# Patient Record
Sex: Female | Born: 1960 | Race: Black or African American | Hispanic: No | Smoking: Never smoker
Health system: Southern US, Community
[De-identification: ages and names within clinical notes are randomized; demographics above are authoritative.]

## PROBLEM LIST (undated history)

## (undated) DIAGNOSIS — D649 Anemia, unspecified: Secondary | ICD-10-CM

## (undated) DIAGNOSIS — I10 Essential (primary) hypertension: Secondary | ICD-10-CM

## (undated) DIAGNOSIS — K219 Gastro-esophageal reflux disease without esophagitis: Secondary | ICD-10-CM

## (undated) DIAGNOSIS — M1990 Unspecified osteoarthritis, unspecified site: Secondary | ICD-10-CM

## (undated) HISTORY — PX: ABDOMINAL HYSTERECTOMY: SHX81

## (undated) HISTORY — DX: Essential (primary) hypertension: I10

## (undated) HISTORY — DX: Unspecified osteoarthritis, unspecified site: M19.90

## (undated) HISTORY — PX: BREAST EXCISIONAL BIOPSY: SUR124

## (undated) HISTORY — PX: FOOT SURGERY: SHX648

## (undated) HISTORY — DX: Gastro-esophageal reflux disease without esophagitis: K21.9

## (undated) HISTORY — PX: TUBAL LIGATION: SHX77

## (undated) HISTORY — DX: Anemia, unspecified: D64.9

---

## 2000-01-25 ENCOUNTER — Other Ambulatory Visit: Admission: RE | Admit: 2000-01-25 | Discharge: 2000-01-25 | Payer: Self-pay | Admitting: Obstetrics and Gynecology

## 2001-03-09 ENCOUNTER — Other Ambulatory Visit: Admission: RE | Admit: 2001-03-09 | Discharge: 2001-03-09 | Payer: Self-pay | Admitting: Obstetrics and Gynecology

## 2001-06-29 ENCOUNTER — Encounter (INDEPENDENT_AMBULATORY_CARE_PROVIDER_SITE_OTHER): Payer: Self-pay

## 2001-06-29 ENCOUNTER — Ambulatory Visit (HOSPITAL_COMMUNITY): Admission: RE | Admit: 2001-06-29 | Discharge: 2001-06-29 | Payer: Self-pay | Admitting: Obstetrics and Gynecology

## 2002-03-29 ENCOUNTER — Other Ambulatory Visit: Admission: RE | Admit: 2002-03-29 | Discharge: 2002-03-29 | Payer: Self-pay | Admitting: Obstetrics and Gynecology

## 2003-04-16 ENCOUNTER — Other Ambulatory Visit: Admission: RE | Admit: 2003-04-16 | Discharge: 2003-04-16 | Payer: Self-pay | Admitting: Obstetrics and Gynecology

## 2003-06-04 ENCOUNTER — Encounter: Admission: RE | Admit: 2003-06-04 | Discharge: 2003-06-04 | Payer: Self-pay | Admitting: Obstetrics and Gynecology

## 2004-01-15 ENCOUNTER — Encounter: Admission: RE | Admit: 2004-01-15 | Discharge: 2004-01-15 | Payer: Self-pay | Admitting: Obstetrics and Gynecology

## 2004-01-23 ENCOUNTER — Ambulatory Visit (HOSPITAL_COMMUNITY): Admission: RE | Admit: 2004-01-23 | Discharge: 2004-01-23 | Payer: Self-pay | Admitting: Interventional Radiology

## 2004-03-15 ENCOUNTER — Inpatient Hospital Stay (HOSPITAL_COMMUNITY): Admission: RE | Admit: 2004-03-15 | Discharge: 2004-03-17 | Payer: Self-pay | Admitting: Obstetrics and Gynecology

## 2004-03-15 ENCOUNTER — Encounter (INDEPENDENT_AMBULATORY_CARE_PROVIDER_SITE_OTHER): Payer: Self-pay | Admitting: Specialist

## 2004-07-30 ENCOUNTER — Ambulatory Visit: Payer: Self-pay | Admitting: Licensed Clinical Social Worker

## 2004-08-05 ENCOUNTER — Ambulatory Visit: Payer: Self-pay | Admitting: Licensed Clinical Social Worker

## 2004-08-05 ENCOUNTER — Ambulatory Visit: Payer: Self-pay | Admitting: Internal Medicine

## 2004-08-26 ENCOUNTER — Ambulatory Visit: Payer: Self-pay | Admitting: Internal Medicine

## 2004-09-15 ENCOUNTER — Ambulatory Visit: Payer: Self-pay | Admitting: Internal Medicine

## 2004-10-14 ENCOUNTER — Ambulatory Visit: Payer: Self-pay | Admitting: Internal Medicine

## 2005-01-21 ENCOUNTER — Ambulatory Visit: Payer: Self-pay | Admitting: Internal Medicine

## 2005-04-22 ENCOUNTER — Ambulatory Visit: Payer: Self-pay | Admitting: Internal Medicine

## 2005-07-20 ENCOUNTER — Ambulatory Visit: Payer: Self-pay | Admitting: Internal Medicine

## 2005-08-02 ENCOUNTER — Encounter: Admission: RE | Admit: 2005-08-02 | Discharge: 2005-08-02 | Payer: Self-pay | Admitting: Obstetrics and Gynecology

## 2005-09-29 ENCOUNTER — Ambulatory Visit: Payer: Self-pay | Admitting: Internal Medicine

## 2005-11-03 ENCOUNTER — Ambulatory Visit: Payer: Self-pay | Admitting: Internal Medicine

## 2005-11-25 ENCOUNTER — Ambulatory Visit: Payer: Self-pay | Admitting: Internal Medicine

## 2006-02-13 ENCOUNTER — Ambulatory Visit: Payer: Self-pay | Admitting: Internal Medicine

## 2006-02-15 ENCOUNTER — Encounter: Admission: RE | Admit: 2006-02-15 | Discharge: 2006-02-15 | Payer: Self-pay | Admitting: Internal Medicine

## 2006-05-17 ENCOUNTER — Ambulatory Visit: Payer: Self-pay | Admitting: Internal Medicine

## 2006-08-16 ENCOUNTER — Encounter: Admission: RE | Admit: 2006-08-16 | Discharge: 2006-08-16 | Payer: Self-pay | Admitting: Obstetrics and Gynecology

## 2006-09-05 ENCOUNTER — Ambulatory Visit: Payer: Self-pay | Admitting: Internal Medicine

## 2007-02-28 ENCOUNTER — Telehealth: Payer: Self-pay | Admitting: *Deleted

## 2007-02-28 DIAGNOSIS — I10 Essential (primary) hypertension: Secondary | ICD-10-CM | POA: Insufficient documentation

## 2007-02-28 DIAGNOSIS — K219 Gastro-esophageal reflux disease without esophagitis: Secondary | ICD-10-CM | POA: Insufficient documentation

## 2007-02-28 DIAGNOSIS — D509 Iron deficiency anemia, unspecified: Secondary | ICD-10-CM | POA: Insufficient documentation

## 2007-03-01 ENCOUNTER — Ambulatory Visit: Payer: Self-pay | Admitting: Internal Medicine

## 2007-03-01 DIAGNOSIS — IMO0002 Reserved for concepts with insufficient information to code with codable children: Secondary | ICD-10-CM | POA: Insufficient documentation

## 2007-03-01 DIAGNOSIS — M5380 Other specified dorsopathies, site unspecified: Secondary | ICD-10-CM | POA: Insufficient documentation

## 2007-03-01 DIAGNOSIS — M1990 Unspecified osteoarthritis, unspecified site: Secondary | ICD-10-CM | POA: Insufficient documentation

## 2007-03-01 DIAGNOSIS — M1710 Unilateral primary osteoarthritis, unspecified knee: Secondary | ICD-10-CM | POA: Insufficient documentation

## 2007-03-13 ENCOUNTER — Emergency Department (HOSPITAL_COMMUNITY): Admission: EM | Admit: 2007-03-13 | Discharge: 2007-03-13 | Payer: Self-pay | Admitting: Emergency Medicine

## 2007-03-14 ENCOUNTER — Telehealth (INDEPENDENT_AMBULATORY_CARE_PROVIDER_SITE_OTHER): Payer: Self-pay | Admitting: *Deleted

## 2007-03-16 ENCOUNTER — Ambulatory Visit: Payer: Self-pay | Admitting: Internal Medicine

## 2007-03-16 DIAGNOSIS — M5030 Other cervical disc degeneration, unspecified cervical region: Secondary | ICD-10-CM | POA: Insufficient documentation

## 2007-03-24 ENCOUNTER — Encounter: Admission: RE | Admit: 2007-03-24 | Discharge: 2007-03-24 | Payer: Self-pay | Admitting: Internal Medicine

## 2007-04-09 ENCOUNTER — Encounter: Admission: RE | Admit: 2007-04-09 | Discharge: 2007-05-16 | Payer: Self-pay | Admitting: Internal Medicine

## 2007-04-19 ENCOUNTER — Ambulatory Visit: Payer: Self-pay | Admitting: Internal Medicine

## 2007-04-20 ENCOUNTER — Ambulatory Visit: Payer: Self-pay | Admitting: Internal Medicine

## 2007-04-20 LAB — CONVERTED CEMR LAB
ALT: 22 units/L (ref 0–35)
AST: 25 units/L (ref 0–37)
Albumin: 3.8 g/dL (ref 3.5–5.2)
Alkaline Phosphatase: 57 units/L (ref 39–117)
BUN: 6 mg/dL (ref 6–23)
Basophils Absolute: 0 10*3/uL (ref 0.0–0.1)
Basophils Relative: 0.1 % (ref 0.0–1.0)
Bilirubin Urine: NEGATIVE
Bilirubin, Direct: 0.1 mg/dL (ref 0.0–0.3)
Blood in Urine, dipstick: NEGATIVE
CO2: 32 meq/L (ref 19–32)
Calcium: 9.4 mg/dL (ref 8.4–10.5)
Chloride: 105 meq/L (ref 96–112)
Cholesterol: 157 mg/dL (ref 0–200)
Creatinine, Ser: 0.9 mg/dL (ref 0.4–1.2)
Eosinophils Absolute: 0.1 10*3/uL (ref 0.0–0.6)
Eosinophils Relative: 1.1 % (ref 0.0–5.0)
GFR calc Af Amer: 87 mL/min
GFR calc non Af Amer: 72 mL/min
Glucose, Bld: 97 mg/dL (ref 70–99)
Glucose, Urine, Semiquant: NEGATIVE
HCT: 34.3 % — ABNORMAL LOW (ref 36.0–46.0)
HDL: 56.6 mg/dL (ref >39.0)
Hemoglobin: 11.8 g/dL — ABNORMAL LOW (ref 12.0–15.0)
LDL Cholesterol: 85 mg/dL (ref 0–99)
Lymphocytes Relative: 31.8 % (ref 12.0–46.0)
MCHC: 34.3 g/dL (ref 30.0–36.0)
MCV: 86.1 fL (ref 78.0–100.0)
Monocytes Absolute: 0.5 10*3/uL (ref 0.2–0.7)
Monocytes Relative: 8.5 % (ref 3.0–11.0)
Neutro Abs: 3.3 10*3/uL (ref 1.4–7.7)
Neutrophils Relative %: 58.5 % (ref 43.0–77.0)
Nitrite: NEGATIVE
Platelets: 338 10*3/uL (ref 150–400)
Potassium: 4.5 meq/L (ref 3.5–5.1)
RBC: 3.99 M/uL (ref 3.87–5.11)
RDW: 12.2 % (ref 11.5–14.6)
Sodium: 142 meq/L (ref 135–145)
Specific Gravity, Urine: 1.02
TSH: 1 microintl units/mL (ref 0.35–5.50)
Total Bilirubin: 0.6 mg/dL (ref 0.3–1.2)
Total CHOL/HDL Ratio: 2.8
Total Protein: 7.2 g/dL (ref 6.0–8.3)
Triglycerides: 75 mg/dL (ref 0–149)
Urobilinogen, UA: 0.2
VLDL: 15 mg/dL (ref 0–40)
WBC Urine, dipstick: NEGATIVE
WBC: 5.7 10*3/uL (ref 4.5–10.5)
pH: 7

## 2007-04-27 ENCOUNTER — Ambulatory Visit: Payer: Self-pay | Admitting: Internal Medicine

## 2007-11-15 ENCOUNTER — Encounter: Admission: RE | Admit: 2007-11-15 | Discharge: 2007-11-15 | Payer: Self-pay | Admitting: Obstetrics and Gynecology

## 2008-03-27 ENCOUNTER — Ambulatory Visit: Payer: Self-pay | Admitting: Internal Medicine

## 2008-12-01 ENCOUNTER — Encounter: Admission: RE | Admit: 2008-12-01 | Discharge: 2008-12-01 | Payer: Self-pay | Admitting: Obstetrics and Gynecology

## 2009-01-22 ENCOUNTER — Telehealth: Payer: Self-pay | Admitting: Internal Medicine

## 2009-01-23 ENCOUNTER — Telehealth: Payer: Self-pay | Admitting: Internal Medicine

## 2009-03-31 LAB — CONVERTED CEMR LAB: Pap Smear: NORMAL

## 2009-04-28 ENCOUNTER — Ambulatory Visit: Payer: Self-pay | Admitting: Internal Medicine

## 2009-04-28 LAB — CONVERTED CEMR LAB
ALT: 19 units/L (ref 0–35)
AST: 23 units/L (ref 0–37)
Albumin: 3.9 g/dL (ref 3.5–5.2)
Alkaline Phosphatase: 47 units/L (ref 39–117)
BUN: 7 mg/dL (ref 6–23)
Basophils Absolute: 0 10*3/uL (ref 0.0–0.1)
Basophils Relative: 0 % (ref 0.0–3.0)
Bilirubin Urine: NEGATIVE
Bilirubin, Direct: 0.1 mg/dL (ref 0.0–0.3)
CO2: 32 meq/L (ref 19–32)
Calcium: 9 mg/dL (ref 8.4–10.5)
Chloride: 110 meq/L (ref 96–112)
Cholesterol: 160 mg/dL (ref 0–200)
Creatinine, Ser: 0.9 mg/dL (ref 0.4–1.2)
Eosinophils Absolute: 0.1 10*3/uL (ref 0.0–0.7)
Eosinophils Relative: 1.1 % (ref 0.0–5.0)
GFR calc non Af Amer: 86.64 mL/min (ref >60)
Glucose, Bld: 97 mg/dL (ref 70–99)
HCT: 35.1 % — ABNORMAL LOW (ref 36.0–46.0)
HDL: 65 mg/dL (ref >39.00)
Hemoglobin, Urine: NEGATIVE
Hemoglobin: 11.7 g/dL — ABNORMAL LOW (ref 12.0–15.0)
Ketones, ur: NEGATIVE mg/dL
LDL Cholesterol: 84 mg/dL (ref 0–99)
Leukocytes, UA: NEGATIVE
Lymphocytes Relative: 30.9 % (ref 12.0–46.0)
Lymphs Abs: 1.6 10*3/uL (ref 0.7–4.0)
MCHC: 33.3 g/dL (ref 30.0–36.0)
MCV: 87.9 fL (ref 78.0–100.0)
Monocytes Absolute: 0.5 10*3/uL (ref 0.1–1.0)
Monocytes Relative: 9.4 % (ref 3.0–12.0)
Neutro Abs: 3.1 10*3/uL (ref 1.4–7.7)
Neutrophils Relative %: 58.6 % (ref 43.0–77.0)
Nitrite: NEGATIVE
Platelets: 285 10*3/uL (ref 150.0–400.0)
Potassium: 4.2 meq/L (ref 3.5–5.1)
RBC: 3.99 M/uL (ref 3.87–5.11)
RDW: 12.9 % (ref 11.5–14.6)
Sodium: 143 meq/L (ref 135–145)
Specific Gravity, Urine: 1.015 (ref 1.000–1.030)
TSH: 0.88 microintl units/mL (ref 0.35–5.50)
Total Bilirubin: 0.4 mg/dL (ref 0.3–1.2)
Total CHOL/HDL Ratio: 2
Total Protein: 7.2 g/dL (ref 6.0–8.3)
Triglycerides: 53 mg/dL (ref 0.0–149.0)
Urine Glucose: NEGATIVE mg/dL
Urobilinogen, UA: 0.2 (ref 0.0–1.0)
VLDL: 10.6 mg/dL (ref 0.0–40.0)
WBC: 5.3 10*3/uL (ref 4.5–10.5)
pH: 7 (ref 5.0–8.0)

## 2009-05-04 ENCOUNTER — Ambulatory Visit: Payer: Self-pay | Admitting: Internal Medicine

## 2009-09-19 ENCOUNTER — Emergency Department (HOSPITAL_COMMUNITY): Admission: EM | Admit: 2009-09-19 | Discharge: 2009-09-19 | Payer: Self-pay | Admitting: Emergency Medicine

## 2009-11-03 ENCOUNTER — Ambulatory Visit: Payer: Self-pay | Admitting: Internal Medicine

## 2009-11-03 LAB — CONVERTED CEMR LAB
Basophils Absolute: 0 10*3/uL (ref 0.0–0.1)
Basophils Relative: 0.6 % (ref 0.0–3.0)
Eosinophils Absolute: 0.1 10*3/uL (ref 0.0–0.7)
Eosinophils Relative: 1.3 % (ref 0.0–5.0)
HCT: 38.1 % (ref 36.0–46.0)
Hemoglobin: 13.2 g/dL (ref 12.0–15.0)
Lymphocytes Relative: 28.3 % (ref 12.0–46.0)
Lymphs Abs: 1.7 10*3/uL (ref 0.7–4.0)
MCHC: 34.7 g/dL (ref 30.0–36.0)
MCV: 87.9 fL (ref 78.0–100.0)
Monocytes Absolute: 0.5 10*3/uL (ref 0.1–1.0)
Monocytes Relative: 7.6 % (ref 3.0–12.0)
Neutro Abs: 3.8 10*3/uL (ref 1.4–7.7)
Neutrophils Relative %: 62.2 % (ref 43.0–77.0)
Platelets: 265 10*3/uL (ref 150.0–400.0)
RBC: 4.34 M/uL (ref 3.87–5.11)
RDW: 14.1 % (ref 11.5–14.6)
WBC: 6.1 10*3/uL (ref 4.5–10.5)

## 2009-11-16 ENCOUNTER — Ambulatory Visit: Payer: Self-pay | Admitting: Internal Medicine

## 2009-11-16 DIAGNOSIS — M79609 Pain in unspecified limb: Secondary | ICD-10-CM | POA: Insufficient documentation

## 2010-01-04 ENCOUNTER — Encounter: Admission: RE | Admit: 2010-01-04 | Discharge: 2010-01-04 | Payer: Self-pay | Admitting: Obstetrics and Gynecology

## 2010-03-03 ENCOUNTER — Encounter: Payer: Self-pay | Admitting: Internal Medicine

## 2010-05-12 ENCOUNTER — Ambulatory Visit: Payer: Self-pay | Admitting: Internal Medicine

## 2010-05-12 LAB — CONVERTED CEMR LAB
ALT: 15 U/L
AST: 18 U/L
Albumin: 3.8 g/dL
Alkaline Phosphatase: 56 U/L
BUN: 12 mg/dL
Basophils Absolute: 0 10*3/uL
Basophils Relative: 0.6 %
Bilirubin Urine: NEGATIVE
Bilirubin, Direct: 0 mg/dL
CO2: 31 meq/L
Calcium: 9.2 mg/dL
Chloride: 106 meq/L
Cholesterol: 170 mg/dL
Creatinine, Ser: 0.9 mg/dL
Eosinophils Absolute: 0.1 10*3/uL
Eosinophils Relative: 1.2 %
GFR calc non Af Amer: 87.36 mL/min
Glucose, Bld: 105 mg/dL — ABNORMAL HIGH
HCT: 38.4 %
HDL: 55.9 mg/dL
Hemoglobin, Urine: NEGATIVE
Hemoglobin: 13.1 g/dL
Ketones, ur: NEGATIVE mg/dL
LDL Cholesterol: 100 mg/dL — ABNORMAL HIGH
Leukocytes, UA: NEGATIVE
Lymphocytes Relative: 29.6 %
Lymphs Abs: 1.8 10*3/uL
MCHC: 34.1 g/dL
MCV: 88.5 fL
Monocytes Absolute: 0.5 10*3/uL
Monocytes Relative: 7.5 %
Neutro Abs: 3.7 10*3/uL
Neutrophils Relative %: 61.1 %
Nitrite: NEGATIVE
Platelets: 255 10*3/uL
Potassium: 4.3 meq/L
RBC: 4.34 M/uL
RDW: 14.1 %
Sodium: 142 meq/L
Specific Gravity, Urine: 1.025
TSH: 1.2 u[IU]/mL
Total Bilirubin: 0.4 mg/dL
Total CHOL/HDL Ratio: 3
Total Protein, Urine: NEGATIVE mg/dL
Total Protein: 6.9 g/dL
Triglycerides: 70 mg/dL
Urine Glucose: NEGATIVE mg/dL
Urobilinogen, UA: 0.2
VLDL: 14 mg/dL
WBC: 6 10*3/uL
pH: 6

## 2010-05-19 ENCOUNTER — Ambulatory Visit: Payer: Self-pay | Admitting: Internal Medicine

## 2010-05-19 ENCOUNTER — Encounter: Payer: Self-pay | Admitting: Internal Medicine

## 2010-05-20 ENCOUNTER — Encounter: Payer: Self-pay | Admitting: Internal Medicine

## 2010-05-29 ENCOUNTER — Encounter: Admission: RE | Admit: 2010-05-29 | Discharge: 2010-05-29 | Payer: Self-pay | Admitting: Internal Medicine

## 2010-06-09 ENCOUNTER — Telehealth: Payer: Self-pay | Admitting: Internal Medicine

## 2010-06-30 ENCOUNTER — Ambulatory Visit: Payer: Self-pay | Admitting: Internal Medicine

## 2010-08-09 ENCOUNTER — Ambulatory Visit
Admission: RE | Admit: 2010-08-09 | Discharge: 2010-08-09 | Payer: Self-pay | Source: Home / Self Care | Attending: Internal Medicine | Admitting: Internal Medicine

## 2010-08-31 NOTE — Procedures (Signed)
Summary: Upper Endoscopy Report/[facility]  Upper Endoscopy Report/[facility]   Imported By: Maryln Gottron 03/08/2010 11:11:02  _____________________________________________________________________  External Attachment:    Type:   Image     Comment:   External Document

## 2010-08-31 NOTE — Assessment & Plan Note (Signed)
Summary: knee pain   Vital Signs:  Patient Profile:   50 Years Old Female Height:     66 inches Weight:      180 pounds Temp:     98.4 degrees F Pulse rate:   84 / minute Resp:     12 per minute BP sitting:   136 / 90  (left arm)  Vitals Entered By: Willy Eddy, LPN (March 01, 2007 2:28 PM)               Chief Complaint:  c/o of rt knee pain.  History of Present Illness: patient is a pleasant, 50 year old female with a history of injury to her knees, who presents with 8 and acute and subacute injury to her right knee and a history of a previous similar injury to her left knee.  The patient plays softball and has had minor cartilage tears to her knees before with significant acute joint swelling, erythema, tenderness, and decrease in range of motion.  Today.  She presents with a subacute injury to her right knee that is tender obviously swollen affecting her ability to ambulate and exercise.  Her other medical problems that we follow her for include hypertension, iron deficiency anemia.  Her medications are reviewed and updated in the chart.  Current Allergies: ! * LAMISIL ! MORPHINE SULFATE CR (MORPHINE SULFATE) ! CODEINE PHOSPHATE (CODEINE PHOSPHATE)  Past Medical History:    Anemia-iron deficiency    GERD    Hypertension    Osteoarthritis  Past Surgical History:    Caesarean section    Tubal ligation    tubular pregnacy    foot surgery   Family History:    father died for anurysm    mother has DM, HTn and breast CA    Family History Breast cancer 1st degree relative <50    Family History Diabetes 1st degree relative    Family History Hypertension  Social History:    Single    Never Smoked   Risk Factors:  Tobacco use:  never   Review of Systems  The patient denies anorexia, weight loss, vision loss, chest pain, dyspnea on exhertion, peripheral edema, prolonged cough, abdominal pain, melena, and severe indigestion/heartburn.     Physical  Exam  General:     alert, well-developed, and well-nourished.   Head:     normocephalic.   Nose:     no nasal discharge.   Neck:     supple.   Lungs:     normal respiratory effort and normal breath sounds.   Heart:     normal rate and regular rhythm.   Abdomen:     soft and non-tender.   Msk:     no redness over joints, joint tenderness, joint swelling, and joint warmth.  over right medial meniscus. Pulses:     R and L carotid,radial,femoral,dorsalis pedis and posterior tibial pulses are full and equal bilaterally Extremities:     No clubbing, cyanosis, edema, or deformity noted with normal full range of motion of all joints.   Neurologic:     No cranial nerve deficits noted. Station and gait are normal. Plantar reflexes are down-going bilaterally. DTRs are symmetrical throughout. Sensory, motor and coordinative functions appear intact.    Impression & Recommendations:  Problem # 1:  MUSCLE SPASM, LUMBAR REGION (ICD-724.8)  Her updated medication list for this problem includes:    Durabac 325-250-20-50 Mg Caps (Apap-salicyl-phenyltolox-caff) ..... One by mouth q 6 hours as need for spasm  Problem # 2:  GERD (ICD-530.81)  Her updated medication list for this problem includes:    Nexium 40 Mg Cpdr (Esomeprazole magnesium) ..... One by mouth daily  Diagnostics Reviewed:  Discussed lifestyle modifications, diet, antacids/medications, and preventive measures. Handout provided.   Problem # 3:  TEAR, MEDIAL MENISCUS, KNEE, CURRENT (ICD-836.0) depo 40 mg injectd into knee Orders: Joint Aspirate / Injection, Large (20610)   Problem # 4:  HYPERTENSION (ICD-401.9)  Her updated medication list for this problem includes:    Ziac 5-6.25 Mg Tabs (Bisoprolol-hydrochlorothiazide) ..... Once daily    Lotrel 5-20 Mg Caps (Amlodipine besy-benazepril hcl) ..... Once daily  BP today: 136/90   Complete Medication List: 1)  Ziac 5-6.25 Mg Tabs (Bisoprolol-hydrochlorothiazide) ....  Once daily 2)  Therapeutic Multivitamin Tabs (Multiple vitamin) .... Once daily 3)  Lotrel 5-20 Mg Caps (Amlodipine besy-benazepril hcl) .... Once daily 4)  Durabac 325-250-20-50 Mg Caps (Apap-salicyl-phenyltolox-caff) .... One by mouth q 6 hours as need for spasm 5)  Nexium 40 Mg Cpdr (Esomeprazole magnesium) .... One by mouth daily   Patient Instructions: 1)  patient presents today for an acute visit for knee pain.  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  I suspect this is again a recurrent small meniscal tear to the knee and have injected.  The joint with 40 mg of Depo-Medrol.  She should continue to ice the knee, 3 to 4 times a day, and to not run or play sports for several weeks. 2)  She asked for a list of for appropriate supplements for her age: 71)  visual or flax seed oil 1 g capsules and 3 or 4 a day and vitamin D 1000 international units once a day, Os-Cal, or calcium with vitamin D 500 to 600 mg with 200 to 400 mg of vitamin D in each tablet two tablets a day, a multivitamin, a multivitamin.  These are the current recommendations for women, and I would add to this, an 81-mg aspirin once a day.    Prescriptions: NEXIUM 40 MG  CPDR (ESOMEPRAZOLE MAGNESIUM) one by mouth daily  #30 x 11   Entered and Authorized by:   Stacie Glaze MD   Signed by:   Stacie Glaze MD on 03/01/2007   Method used:   Electronically sent to ...       [facility] [address]       [address] [place], [place]  [address]       Ph: [phone]       Fax: [phone]   RxID:   [id number] DURABAC 325-250-20-50 MG  CAPS (APAP-SALICYL-PHENYLTOLOX-CAFF) one by mouth q 6 hours as need for spasm  #30 x 5   Entered and Authorized by:   Stacie Glaze MD   Signed by:   Stacie Glaze MD on 03/01/2007   Method used:   Electronically sent to ...       [facility] [address]        [address] [place], [place]  [address]       Ph: [phone]       Fax: [phone]   RxID:   [id number]

## 2010-08-31 NOTE — Assessment & Plan Note (Signed)
Summary: cpx/no pap/njr   Vital Signs:  Patient profile:   50 year old female Height:      66 inches Weight:      186 pounds BMI:     30.13 Temp:     98.2 degrees F oral Pulse rate:   72 / minute Resp:     14 per minute BP sitting:   130 / 80  (left arm)  Vitals Entered By: Willy Eddy, LPN (May 04, 2009 3:22 PM)  CC:  cpx.  History of Present Illness: Mild anemia and need to stay on the iron The pt was asked about all immunizations, health maint. 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 Once Daily 2)  Therapeutic Multivitamin   Tabs (Multiple Vitamin) .... Once Daily 3)  Lotrel 10-20 Mg Caps (Amlodipine Besy-Benazepril Hcl) .Marland Kitchen.. 1 Once Daily 4)  Durabac 325-250-20-50 Mg  Caps (Apap-Salicyl-Phenyltolox-Caff) .... One By Mouth Q 6 Hours As Need For Spasm 5)  Omeprazole 20 Mg Cpdr (Omeprazole) .... One By Mouth Daily 6)  Feosol 45 Mg  Tabs (Iron) .... Once Daily 7)  Bl Flax Seed Oil 1000 Mg  Caps (Flaxseed (Linseed)) .... Once Daily 8)  Omega-3 350 Mg  Caps (Omega-3 Fatty Acids) .... Once Daily 9)  Calcium 500/d 500-125 Mg-Unit  Tabs (Calcium Carbonate-Vitamin D) .... Once Daily 10)  Stress 500 B-Complex   Tabs (B Complex-C-Folic Acid) .... Once Daily 11)  Reglan 5 Mg  Tabs (Metoclopramide  Hcl) .... Take One By Mouth Q Ac Supper and Hs  Current Medications (verified): 1)  Ziac 5-6.25 Mg  Tabs (Bisoprolol-Hydrochlorothiazide) .... Once Daily 2)  Therapeutic Multivitamin   Tabs (Multiple Vitamin) .... Once Daily 3)  Lotrel 10-20 Mg Caps (Amlodipine Besy-Benazepril Hcl) .Marland Kitchen.. 1 Once Daily 4)  Durabac 325-250-20-50 Mg  Caps (Apap-Salicyl-Phenyltolox-Caff) .... One By Mouth Q 6 Hours As Need For Spasm 5)  Dexilant 60 Mg Cpdr (Dexlansoprazole) .Marland Kitchen.. 1 Once Daily 6)  Feosol 45 Mg  Tabs (Iron) .... Once Daily 7)  Bl Flax Seed Oil 1000 Mg  Caps (Flaxseed (Linseed)) .... Once Daily 8)  Omega-3 350 Mg  Caps (Omega-3 Fatty Acids) .... Once Daily 9)  Calcium 500/d 500-125 Mg-Unit  Tabs (Calcium Carbonate-Vitamin D) .... Once Daily 10)  Stress 500 B-Complex   Tabs (B Complex-C-Folic Acid) .... Once Daily 11)  Reglan 5 Mg  Tabs (Metoclopramide Hcl) .... Take One By Mouth Q Ac Supper and Hs  Allergies (verified): 1)  ! * Lamisil 2)  ! Morphine Sulfate Cr (Morphine Sulfate) 3)  ! Codeine Phosphate (Codeine Phosphate)  Past History:  Family History: Last updated: Mar 14, 2007 father died for anurysm mother has DM, HTn and breast CA Family History Breast cancer 1st degree relative <50 Family History Diabetes 1st degree relative Family History Hypertension  Social History: Last updated:  03/01/2007 Single Never Smoked  Risk Factors: Smoking Status: never (03/01/2007)  Past medical, surgical, family and social histories (including risk factors) reviewed, and no changes noted (except as noted below).  Past Medical History: Reviewed history from 03/01/2007 and no changes required. Anemia-iron deficiency GERD Hypertension Osteoarthritis  Past Surgical History: Reviewed history from 03/01/2007 and no changes required. Caesarean section Tubal ligation tubular pregnacy foot surgery  Family History: Reviewed history from 03/01/2007 and no changes required. father died for  anurysm mother has DM, HTn and breast CA Family History Breast cancer 1st degree relative <50 Family History Diabetes 1st degree relative Family History Hypertension  Social History: Reviewed history from 03/01/2007 and no changes required. Single Never Smoked  Review of Systems  The patient denies anorexia, fever, weight loss, weight gain, vision loss, decreased hearing, hoarseness, chest pain, syncope, dyspnea on exertion, peripheral edema, prolonged cough, headaches, hemoptysis, abdominal pain, melena, hematochezia, severe indigestion/heartburn, hematuria, incontinence, genital sores, muscle weakness, suspicious skin lesions, transient blindness, difficulty walking, depression, unusual weight change, abnormal bleeding, enlarged lymph nodes, angioedema, and breast masses.    Physical Exam  General:  AA femalewell-developed and well-nourished.   Head:  Normocephalic and atraumatic without obvious abnormalities. No apparent alopecia or balding. Eyes:  No corneal or conjunctival inflammation noted. EOMI. Perrla. Funduscopic exam benign, without hemorrhages, exudates or papilledema. Vision grossly normal. Nose:  External nasal examination shows no deformity or inflammation. Nasal mucosa are pink and moist without lesions or exudates. Mouth:  Oral mucosa and oropharynx without lesions or exudates.  Teeth in good repair. Neck:  No deformities, masses, or tenderness noted. Lungs:  Normal respiratory effort, chest expands symmetrically. Lungs are clear to auscultation, no crackles or wheezes. Heart:  Normal rate and regular rhythm. S1 and S2 normal without gallop, murmur, click, rub or other extra sounds. Abdomen:  Bowel sounds positive,abdomen soft and non-tender without masses, organomegaly or hernias noted. Msk:  decreased ROM, joint tenderness, and joint warmth.   Pulses:  R and L carotid,radial,femoral,dorsalis pedis and posterior tibial pulses are full and equal bilaterally Extremities:   No clubbing, cyanosis, edema, or deformity noted with normal full range of motion of all joints.   Neurologic:  No cranial nerve deficits noted. Station and gait are normal. Plantar reflexes are down-going bilaterally. DTRs are symmetrical throughout. Sensory, motor and coordinative functions appear intact.   Impression & Recommendations:  Problem # 1:  PREVENTIVE HEALTH CARE (ICD-V70.0)  The pt was asked about all immunizations, health maint. services that are appropriate to their age and was given guidance on diet exercize  and weight management  Mammogram: normal (03/31/2009) Pap smear: normal (03/31/2009) Td Booster: Tdap (04/27/2007)   Chol: 160 (04/28/2009)   HDL: 65.00 (04/28/2009)   LDL: 84 (04/28/2009)   TG: 53.0 (04/28/2009) TSH: 0.88 (04/28/2009)   Next mammogram due:: 04/2010 (05/04/2009)  Discussed using sunscreen, use of alcohol, drug use, self breast exam, routine dental care, routine eye care, schedule for GYN exam, routine physical exam, seat belts, multiple vitamins, osteoporosis prevention, adequate calcium intake in diet, recommendations for immunizations, mammograms and Pap smears.  Discussed exercise and checking cholesterol.  Discussed gun safety, safe sex, and contraception.  Problem # 2:  HYPERTENSION (ICD-401.9) Assessment: Unchanged  Her updated medication list for this problem includes:    Ziac 5-6.25 Mg Tabs (Bisoprolol-hydrochlorothiazide) ..... Once daily    Lotrel 10-20 Mg Caps (Amlodipine besy-benazepril hcl) .Marland Kitchen... 1 once daily  BP today: 130/80 Prior BP: 130/70 (03/27/2008)  Labs Reviewed: K+: 4.2 (  04/28/2009) Creat: : 0.9 (04/28/2009)   Chol: 160 (04/28/2009)   HDL: 65.00 (04/28/2009)   LDL: 84 (04/28/2009)   TG: 53.0 (04/28/2009)  Problem # 3:  ANEMIA-IRON DEFICIENCY (ICD-280.9) stay on the iron after donating blood Her updated medication list for this problem includes:    Feosol 45 Mg Tabs (Iron) ..... Once daily  Hgb: 11.7 (04/28/2009)   Hct:  35.1 (04/28/2009)   Platelets: 285.0 (04/28/2009) RBC: 3.99 (04/28/2009)   RDW: 12.9 (04/28/2009)   WBC: 5.3 (04/28/2009) MCV: 87.9 (04/28/2009)   MCHC: 33.3 (04/28/2009) TSH: 0.88 (04/28/2009)  Complete Medication List: 1)  Ziac 5-6.25 Mg Tabs (Bisoprolol-hydrochlorothiazide) .... Once daily 2)  Therapeutic Multivitamin Tabs (Multiple vitamin) .... Once daily 3)  Lotrel 10-20 Mg Caps (Amlodipine besy-benazepril hcl) .Marland Kitchen.. 1 once daily 4)  Durabac 325-250-20-50 Mg Caps (Apap-salicyl-phenyltolox-caff) .... One by mouth q 6 hours as need for spasm 5)  Dexilant 60 Mg Cpdr (Dexlansoprazole) .Marland Kitchen.. 1 once daily 6)  Feosol 45 Mg Tabs (Iron) .... Once daily 7)  Bl Flax Seed Oil 1000 Mg Caps (Flaxseed (linseed)) .... Once daily 8)  Omega-3 350 Mg Caps (Omega-3 fatty acids) .... Once daily 9)  Calcium 500/d 500-125 Mg-unit Tabs (Calcium carbonate-vitamin d) .... Once daily 10)  Stress 500 B-complex Tabs (B complex-c-folic acid) .... Once daily 11)  Reglan 5 Mg Tabs (Metoclopramide hcl) .... Take one by mouth q ac supper and hs  Patient Instructions: 1)  Please schedule a follow-up appointment in 6 months. 2)  CBC w/ Diff prior to visit, ICD-9: 280.9   Preventive Care Screening  Mammogram:    Date:  03/31/2009    Next Due:  04/2010    Results:  normal   Pap Smear:    Date:  03/31/2009    Next Due:  04/2010    Results:  normal

## 2010-08-31 NOTE — Assessment & Plan Note (Signed)
Summary: knee injection/bmw   Vital Signs:  Patient profile:   50 year old female Height:      66 inches Weight:      192 pounds BMI:     31.10 Temp:     98.2 degrees F oral Pulse rate:   80 / minute Resp:     14 per minute BP sitting:   154 / 84  (left arm)  Vitals Entered By: Willy Eddy, LPN (June 30, 2010 11:54 AM) CC: rt knee pain-requesting injection, Hypertension Management Is Patient Diabetic? No   Primary Care Dusan Lipford:  Stacie Glaze MD  CC:  rt knee pain-requesting injection and Hypertension Management.  History of Present Illness: The pt went GI for a colon and  the Hgb monitering was up and the iron was stopped In pain and blood pressur is up some Knee pain for 30 days hx of OA and possible tear in meniscus prior injusrt similar and reponded to injection in past   Hypertension History:      She denies headache, chest pain, palpitations, dyspnea with exertion, orthopnea, PND, peripheral edema, visual symptoms, neurologic problems, syncope, and side effects from treatment.        Positive major cardiovascular risk factors include hypertension.  Negative major cardiovascular risk factors include female age less than 69 years old and non-tobacco-user status.     Preventive Screening-Counseling & Management  Alcohol-Tobacco     Smoking Status: never     Tobacco Counseling: not indicated; no tobacco use  Problems Prior to Update: 1)  Leg Pain, Left  (ICD-729.5) 2)  Preventive Health Care  (ICD-V70.0) 3)  Degeneration, Cervical Disc  (ICD-722.4) 4)  Muscle Spasm, Lumbar Region  (ICD-724.8) 5)  Tear, Medial Meniscus, Knee, Current  (ICD-836.0) 6)  Osteoarthrosis, Local Nos, Lower Leg  (ICD-715.36) 7)  Family History Diabetes 1st Degree Relative  (ICD-V18.0) 8)  Family History Breast Cancer 1st Degree Relative <50  (ICD-V16.3) 9)  Osteoarthritis  (ICD-715.90) 10)  Hypertension  (ICD-401.9) 11)  Gerd  (ICD-530.81) 12)  Anemia-iron Deficiency   (ICD-280.9)  Current Problems (verified): 1)  Leg Pain, Left  (ICD-729.5) 2)  Preventive Health Care  (ICD-V70.0) 3)  Degeneration, Cervical Disc  (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 Once Daily 2)  Therapeutic Multivitamin   Tabs (Multiple Vitamin) .... Once Daily 3)  Lotrel 10-20 Mg Caps (Amlodipine Besy-Benazepril Hcl) .Marland Kitchen.. 1 Once Daily 4)  Durabac 325-250-20-50 Mg  Caps (Apap-Salicyl-Phenyltolox-Caff) .... One By Mouth Q 6 Hours As Need For Spasm 5)  Dexilant 60 Mg Cpdr (Dexlansoprazole) .Marland Kitchen.. 1 Once Daily 6)  Feosol 45 Mg  Tabs (Iron) .... Once Daily 7)  Bl Flax Seed Oil 1000 Mg  Caps (Flaxseed (Linseed)) .... Once Daily 8)  Omega-3 350 Mg  Caps (Omega-3 Fatty Acids) .... Once Daily 9)  Calcium 500/d 500-125 Mg-Unit  Tabs (Calcium Carbonate-Vitamin D) .... Once Daily 10)  Methylprednisolone 4 Mg Tabs (Methylprednisolone) .... 4 For 2 Days, 3 For 2 Days, 2 For 2 Days, Then 1 For 2 Days  Current Medications (verified): 1)  Ziac 5-6.25 Mg  Tabs (Bisoprolol-Hydrochlorothiazide) .... Once Daily 2)  Therapeutic Multivitamin   Tabs (Multiple Vitamin) .... Once Daily 3)  Lotrel 10-20 Mg Caps (Amlodipine Besy-Benazepril Hcl) .Marland Kitchen.. 1 Once Daily 4)  Durabac 325-250-20-50 Mg  Caps (Apap-Salicyl-Phenyltolox-Caff) .... One By Mouth Q 6 Hours As Need For Spasm 5)  Dexilant 60 Mg Cpdr (Dexlansoprazole) .Marland Kitchen.. 1 Once Daily 6)  Bl Flax Seed Oil 1000 Mg  Caps (Flaxseed (Linseed)) .... Once Daily 7)  Omega-3 350 Mg  Caps  (Omega-3 Fatty Acids) .... Once Daily 8)  Calcium 500/d 500-125 Mg-Unit  Tabs (Calcium Carbonate-Vitamin D) .... Once Daily  Allergies (verified): 1)  ! * Lamisil 2)  ! Morphine Sulfate Cr (Morphine Sulfate) 3)  ! Codeine Phosphate (Codeine Phosphate)  Past History:  Family History: Last updated: 2007-03-21 father died for anurysm mother has DM, HTn and breast CA Family History Breast cancer 1st degree relative <50 Family History Diabetes 1st degree relative Family History Hypertension  Social History: Last updated: Mar 21, 2007 Single Never Smoked  Risk Factors: Smoking Status: never (06/30/2010)  Past medical, surgical, family and social histories (including risk factors) reviewed, and no changes noted (except as noted below).  Past Medical History: Reviewed history from 03/21/07 and no changes required. Anemia-iron deficiency GERD Hypertension Osteoarthritis  Past Surgical History: Reviewed history from 11/16/2009 and no changes required. Caesarean section Tubal ligation tubular pregnacy foot surgery  limited Hysterectomy  still has cervix! and ovaries  Family History: Reviewed history from 21-Mar-2007 and no changes required. father died for anurysm mother has DM, HTn and breast CA Family History Breast cancer 1st degree relative <50 Family History Diabetes 1st degree relative Family History Hypertension  Social History: Reviewed history from 03/21/2007 and no changes required. Single Never Smoked  Review of Systems  The patient denies anorexia, fever, weight loss, weight gain, vision loss, decreased hearing, hoarseness, chest pain, syncope, dyspnea on exertion, peripheral edema, prolonged cough, headaches, hemoptysis, abdominal pain, melena, hematochezia, severe indigestion/heartburn, hematuria, incontinence, genital sores, muscle weakness, suspicious skin lesions, transient blindness, difficulty walking, depression, unusual weight change, abnormal  bleeding, enlarged lymph nodes, angioedema, and breast masses.    Physical Exam  General:  AA femalewell-developed and well-nourished.   Head:  Normocephalic and atraumatic without obvious abnormalities. No apparent alopecia or balding. Eyes:  pupils equal and pupils round.   Ears:  R ear normal and L ear normal.   Lungs:  Normal respiratory effort, chest expands symmetrically. Lungs are clear to auscultation, no crackles or wheezes. Msk:  decreased ROM, joint tenderness, and joint swelling.   Neurologic:  alert & oriented X3 and gait normal.     Impression & Recommendations:  Problem # 1:  TEAR, MEDIAL MENISCUS, KNEE, CURRENT (ICD-836.0) the pt has a hx of similar injury and repsonse to injecton Informed consen obtained and then the right knee joint was prepped in a sterile manor and 40 mg depo and 1/2 cc 1% lidocaine injected into the synovial space. After care discussed. Pt tolerated procedure well.  Orders: Depo- Medrol 40mg  (J1030) Joint Aspirate / Injection, Large (20610)  Problem # 2:  OSTEOARTHROSIS, LOCAL NOS, LOWER LEG (ICD-715.36)  Her updated medication list for this problem includes:    Durabac 325-250-20-50 Mg Caps (Apap-salicyl-phenyltolox-caff) ..... One by mouth q 6 hours as need for spasm  Complete Medication List: 1)  Ziac 5-6.25 Mg Tabs (Bisoprolol-hydrochlorothiazide) .... Once daily 2)  Therapeutic Multivitamin Tabs (Multiple vitamin) .... Once daily 3)  Lotrel 10-20  Mg Caps (Amlodipine besy-benazepril hcl) .Marland Kitchen.. 1 once daily 4)  Durabac 325-250-20-50 Mg Caps (Apap-salicyl-phenyltolox-caff) .... One by mouth q 6 hours as need for spasm 5)  Dexilant 60 Mg Cpdr (Dexlansoprazole) .Marland Kitchen.. 1 once daily 6)  Bl Flax Seed Oil 1000 Mg Caps (Flaxseed (linseed)) .... Once daily 7)  Omega-3 350 Mg Caps (Omega-3 fatty acids) .... Once daily 8)  Calcium 500/d 500-125 Mg-unit Tabs (Calcium carbonate-vitamin d) .... Once daily  Hypertension Assessment/Plan:      The patient's  hypertensive risk group is category A: No risk factors and no target organ damage.  Her calculated 10 year risk of coronary heart disease is 6 %.  Today's blood pressure is 154/84.  Her blood pressure goal is < 140/90.   Orders Added: 1)  Est. Patient Level III [81191] 2)  Depo- Medrol 40mg  [J1030] 3)  Joint Aspirate / Injection, Large [20610]

## 2010-08-31 NOTE — Assessment & Plan Note (Signed)
Summary: CPX NO PAP//CCM/PT [facility] FROM BMP/CJR   Vital Signs:  Patient profile:   50 year old female Height:      66 inches Weight:      177 pounds BMI:     28.67 Temp:     98.5 degrees F oral Pulse rate:   80 / minute Pulse rhythm:   regular Resp:     14 per minute BP sitting:   130 / 78  (left arm) Cuff size:   regular  Vitals Entered By: Duard Brady LPN (May 19, 2010 2:02 PM) CC: cpx - doing well  Is Patient Diabetic? No   Primary Care Provider:  Stacie Glaze MD  CC:  cpx - doing well .  History of Present Illness: The pt was asked about all immunizations, health maint. 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 Once Daily 2)  Therapeutic Multivitamin   Tabs (Multiple Vitamin) .... Once Daily 3)  Lotrel 10-20 Mg Caps (Amlodipine Besy-Benazepril Hcl) .Marland Kitchen.. 1 Once Daily 4)  Durabac 325-250-20-50 Mg  Caps (Apap-Salicyl-Phenyltolox-Caff) ....  One By Mouth Q 6 Hours As Need For Spasm 5)  Dexilant 60 Mg Cpdr (Dexlansoprazole) .Marland Kitchen.. 1 Once Daily 6)  Feosol 45 Mg  Tabs (Iron) .... Once Daily 7)  Bl Flax Seed Oil 1000 Mg  Caps (Flaxseed (Linseed)) .... Once Daily 8)  Omega-3 350 Mg  Caps (Omega-3 Fatty Acids) .... Once Daily 9)  Calcium 500/d 500-125 Mg-Unit  Tabs (Calcium Carbonate-Vitamin D) .... Once Daily 10)  Stress 500 B-Complex   Tabs (B Complex-C-Folic Acid) .... Once Daily 11)  Reglan 5 Mg  Tabs (Metoclopramide Hcl) .... Take One By Mouth Q Ac Supper and Hs  Current Medications (verified): 1)  Ziac 5-6.25 Mg  Tabs (Bisoprolol-Hydrochlorothiazide) .... Once Daily 2)  Therapeutic Multivitamin   Tabs (Multiple Vitamin) .... Once Daily 3)  Lotrel 10-20 Mg Caps (Amlodipine Besy-Benazepril Hcl) .Marland Kitchen.. 1 Once Daily 4)  Durabac 325-250-20-50 Mg  Caps (Apap-Salicyl-Phenyltolox-Caff) .... One By Mouth Q 6 Hours As Need For Spasm 5)  Dexilant 60 Mg Cpdr (Dexlansoprazole) .Marland Kitchen.. 1 Once Daily 6)  Feosol 45 Mg  Tabs (Iron) .... Once Daily 7)  Bl Flax Seed Oil 1000 Mg  Caps (Flaxseed (Linseed)) .... Once Daily 8)  Omega-3 350 Mg  Caps (Omega-3 Fatty Acids) .... Once Daily 9)  Calcium 500/d 500-125 Mg-Unit  Tabs (Calcium Carbonate-Vitamin D) .... Once Daily 10)  Methylprednisolone 4 Mg Tabs (Methylprednisolone) .Marland KitchenMarland KitchenMarland Kitchen  4 For 2 Days, 3 For 2 Days, 2 For 2 Days, Then 1 For 2 Days  Allergies: 1)  ! * Lamisil 2)  ! Morphine Sulfate Cr (Morphine Sulfate) 3)  ! Codeine Phosphate (Codeine Phosphate)  Past History:  Family History: Last updated: 03-31-2007 father died for anurysm mother has DM, HTn and breast CA Family History Breast cancer 1st degree relative <50 Family History Diabetes 1st degree relative Family History Hypertension  Social History: Last updated: 2007-03-31 Single Never Smoked  Risk Factors: Smoking Status: never (05/19/2010)  Past medical, surgical, family and social histories (including risk factors) reviewed, and no  changes noted (except as noted below).  Past Medical History: Reviewed history from 03-31-2007 and no changes required. Anemia-iron deficiency GERD Hypertension Osteoarthritis  Past Surgical History: Reviewed history from 11/16/2009 and no changes required. Caesarean section Tubal ligation tubular pregnacy foot surgery  limited Hysterectomy  still has cervix! and ovaries  Family History: Reviewed history from 03/31/2007 and no changes required. father died for anurysm mother has DM, HTn and breast CA Family History Breast cancer 1st degree relative <50 Family History Diabetes 1st degree relative Family History Hypertension  Social History: Reviewed history from March 31, 2007 and no changes required. Single Never Smoked  Review of Systems  The patient denies anorexia, fever, weight loss, weight gain, vision loss, decreased hearing, hoarseness, chest pain, syncope, dyspnea on exertion, peripheral edema, prolonged cough, headaches, hemoptysis, abdominal pain, melena, hematochezia, severe indigestion/heartburn, hematuria, incontinence, genital sores, muscle weakness, suspicious skin lesions, transient blindness, difficulty walking, depression, unusual weight change, abnormal bleeding, enlarged lymph nodes, angioedema, and breast masses.    Physical Exam  General:  AA femalewell-developed and well-nourished.   Head:  Normocephalic and atraumatic without obvious abnormalities. No apparent alopecia or balding. Eyes:  pupils equal and pupils round.   Ears:  R ear normal and L ear normal.   Nose:  no external deformity and no nasal discharge.   Mouth:  Oral mucosa and oropharynx without lesions or exudates.  Teeth in good repair. Neck:  No deformities, masses, or tenderness noted. Lungs:  Normal respiratory effort, chest expands symmetrically. Lungs are clear to auscultation, no crackles or wheezes. Heart:  Normal rate and regular rhythm. S1 and S2 normal without gallop, murmur,  click, rub or other extra sounds. Abdomen:  Bowel sounds positive,abdomen soft and non-tender without masses, organomegaly or hernias noted. Msk:  decreased ROM, joint tenderness, and joint warmth.   Extremities:  No clubbing, cyanosis, edema, or deformity noted with normal full range of motion of all joints.   Neurologic:  No cranial nerve deficits noted. Station and gait are normal. Plantar reflexes are down-going bilaterally. DTRs are symmetrical throughout. Sensory, motor and coordinative functions appear intact. Skin:  Intact without suspicious lesions or rashes Cervical Nodes:  No lymphadenopathy noted Axillary Nodes:  No palpable lymphadenopathy Inguinal Nodes:  No significant adenopathy Psych:  Cognition and judgment appear intact. Alert and cooperative with normal attention span and concentration. No apparent delusions, illusions, hallucinations   Impression & Recommendations:  Problem # 1:  PREVENTIVE HEALTH CARE (ICD-V70.0) The pt was asked about all immunizations, health maint. services that are appropriate to their age and was given guidance on diet exercize  and weight management  Mammogram: normal (03/31/2009) Pap smear: normal (03/31/2009) Td Booster: Tdap (04/27/2007)   Chol: 170 (05/12/2010)   HDL: 55.90 (05/12/2010)   LDL: 100 (05/12/2010)   TG: 70.0 (05/12/2010) TSH: 1.20 (05/12/2010)   Next mammogram due:: 04/2010 (05/04/2009)  Discussed using  sunscreen, use of alcohol, drug use, self breast exam, routine dental care, routine eye care, schedule for GYN exam, routine physical exam, seat belts, multiple vitamins, osteoporosis prevention, adequate calcium intake in diet, recommendations for immunizations, mammograms and Pap smears.  Discussed exercise and checking cholesterol.  Discussed gun safety, safe sex, and contraception.  Problem # 2:  DEGENERATION, CERVICAL DISC (ICD-722.4)  C 4? repeat mri for progression medrol dose pack consider referral based on MRI referral  to neurosurgeon is symptoms persist and mri reveal nerve compresion  Orders: Radiology Referral (Radiology)  Complete Medication List: 1)  Ziac 5-6.25 Mg Tabs (Bisoprolol-hydrochlorothiazide) .... Once daily 2)  Therapeutic Multivitamin Tabs (Multiple vitamin) .... Once daily 3)  Lotrel 10-20 Mg Caps (Amlodipine besy-benazepril hcl) .Marland Kitchen.. 1 once daily 4)  Durabac 325-250-20-50 Mg Caps (Apap-salicyl-phenyltolox-caff) .... One by mouth q 6 hours as need for spasm 5)  Dexilant 60 Mg Cpdr (Dexlansoprazole) .Marland Kitchen.. 1 once daily 6)  Feosol 45 Mg Tabs (Iron) .... Once daily 7)  Bl Flax Seed Oil 1000 Mg Caps (Flaxseed (linseed)) .... Once daily 8)  Omega-3 350 Mg Caps (Omega-3 fatty acids) .... Once daily 9)  Calcium 500/d 500-125 Mg-unit Tabs (Calcium carbonate-vitamin d) .... Once daily 10)  Methylprednisolone 4 Mg Tabs (Methylprednisolone) .... 4 for 2 days, 3 for 2 days, 2 for 2 days, then 1 for 2 days  Other Orders: EKG w/ Interpretation (93000)  Patient Instructions: 1)  Please schedule a follow-up appointment in 2 months. 2)  you will  be called about an MRI 3)  take the medrol pack as directed Prescriptions: METHYLPREDNISOLONE 4 MG TABS (METHYLPREDNISOLONE) 4 for 2 days, 3 for 2 days, 2 for 2 days, then 1 for 2 days  #20 x 0   Entered and Authorized by:   Stacie Glaze MD   Signed by:   Stacie Glaze MD on 05/19/2010   Method used:   Electronically to        [facility]  [address] [phone]* (retail)       [address]       [place], [place]  [id number]       Ph: [phone] or [phone]       Fax: [phone]   RxID:   [id number]    Orders Added: 1)  EKG w/ Interpretation [93000] 2)  Est. Patient 40-64 years [99396] 3)  Est. Patient Level III [95638] 4)  Radiology Referral [Radiology]

## 2010-08-31 NOTE — Letter (Signed)
Summary: [facility]  [facility]   Imported By: Maryln Gottron 06/01/2010 14:57:30  _____________________________________________________________________  External Attachment:    Type:   Image     Comment:   External Document

## 2010-08-31 NOTE — Progress Notes (Signed)
Summary: mri results/knee pain  Phone Note Call from Patient Call back at [phone]   Caller: Patient Call For: Stacie Glaze MD Reason for Call: Acute Illness Complaint: Urinary/GYN Problems Summary of Call: pt needs mri results also having knee pain. Initial call taken by: Heron Sabins,  June 09, 2010 11:18 AM  Follow-up for Phone Call        pt informed- still herniation but no change since last mri per dr Lovell Sheehan- ov given for 11-30 for injection Follow-up by: Willy Eddy, LPN,  June 09, 2010 3:13 PM

## 2010-08-31 NOTE — Assessment & Plan Note (Signed)
Summary: 6 month rov/njr/PT RESCD FROM BUMP//CCM   Vital Signs:  Patient profile:   50 year old female Height:      66 inches Weight:      190 pounds BMI:     30.78 Temp:     98.2 degrees F oral Pulse rate:   72 / minute Resp:     14 per minute BP sitting:   126 / 80  (left arm)  Vitals Entered By: Willy Eddy, LPN (November 16, 2009 8:07 AM) CC: roa labs, Hypertension Management   CC:  roa labs and Hypertension Management.  History of Present Illness: The pt presents for follow up had some deep pain and "cold" sinces in the lateral calf. The sensation comes and goes and stays for several hours. She does not recall associated back pain. Has some tremors in the right hand that she associated with hunger? Hypoglycemia?  Follow up for anemia?  Hypertension History:      She denies headache, chest pain, palpitations, dyspnea with exertion, orthopnea, PND, peripheral edema, visual symptoms, neurologic problems, syncope, and side effects from treatment.        Positive major cardiovascular risk factors include hypertension.  Negative major cardiovascular risk factors include female age less than 45 years old and non-tobacco-user status.     Preventive Screening-Counseling & Management  Alcohol-Tobacco     Smoking Status: never  Problems Prior to Update: 1)  Preventive Health Care  (ICD-V70.0) 2)  Degeneration, Cervical Disc  (ICD-722.4) 3)  Muscle Spasm, Lumbar Region  (ICD-724.8) 4)  Tear, Medial Meniscus, Knee, Current  (ICD-836.0) 5)  Osteoarthrosis, Local Nos, Lower Leg  (ICD-715.36) 6)  Family History Diabetes 1st Degree Relative  (ICD-V18.0) 7)  Family History Breast Cancer 1st Degree Relative <50  (ICD-V16.3) 8)  Osteoarthritis  (ICD-715.90) 9)  Hypertension  (ICD-401.9) 10)  Gerd  (ICD-530.81) 11)  Anemia-iron Deficiency  (ICD-280.9)  Current Problems (verified): 1)  Preventive Health Care  (ICD-V70.0) 2)  Degeneration, Cervical Disc  (ICD-722.4) 3)  Muscle  Spasm, Lumbar Region  (ICD-724.8) 4)  Tear, Medial Meniscus, Knee, Current  (ICD-836.0) 5)  Osteoarthrosis, Local Nos, Lower Leg  (ICD-715.36) 6)  Family History Diabetes 1st Degree Relative  (ICD-V18.0) 7)  Family History Breast Cancer 1st Degree Relative <50  (ICD-V16.3) 8)  Osteoarthritis  (ICD-715.90) 9)  Hypertension  (ICD-401.9) 10)  Gerd  (ICD-530.81) 11)  Anemia-iron Deficiency  (ICD-280.9)  Medications Prior to Update: 1)  Ziac 5-6.25 Mg  Tabs (Bisoprolol-Hydrochlorothiazide) .... Once Daily 2)  Therapeutic Multivitamin   Tabs (Multiple Vitamin) .... Once Daily 3)  Lotrel 10-20 Mg Caps (Amlodipine Besy-Benazepril Hcl) .Marland Kitchen.. 1 Once Daily 4)  Durabac 325-250-20-50 Mg  Caps (Apap-Salicyl-Phenyltolox-Caff) .... One By Mouth Q 6 Hours As Need For Spasm 5)  Dexilant 60 Mg Cpdr (Dexlansoprazole) .Marland Kitchen.. 1 Once Daily 6)  Feosol 45 Mg  Tabs (Iron) .... Once Daily 7)  Bl Flax Seed Oil 1000 Mg  Caps (Flaxseed (Linseed)) .... Once Daily 8)  Omega-3 350 Mg  Caps (Omega-3 Fatty Acids) .... Once Daily 9)  Calcium 500/d 500-125 Mg-Unit  Tabs (Calcium Carbonate-Vitamin D) .... Once Daily 10)  Stress 500 B-Complex   Tabs (B Complex-C-Folic Acid) .... Once Daily 11)  Reglan 5 Mg  Tabs (Metoclopramide Hcl) .... Take One By Mouth Q Ac Supper and Hs  Current Medications (verified): 1)  Ziac 5-6.25 Mg  Tabs (Bisoprolol-Hydrochlorothiazide) .... Once Daily 2)  Therapeutic Multivitamin   Tabs (Multiple Vitamin) .... Once Daily 3)  Lotrel 10-20 Mg Caps (Amlodipine Besy-Benazepril Hcl) .Marland Kitchen.. 1 Once Daily 4)  Durabac 325-250-20-50 Mg  Caps (Apap-Salicyl-Phenyltolox-Caff) .... One By Mouth Q 6 Hours As Need For Spasm 5)  Dexilant 60 Mg Cpdr (Dexlansoprazole) .Marland Kitchen.. 1 Once Daily 6)  Feosol 45 Mg  Tabs (Iron) .... Once Daily 7)  Bl Flax Seed Oil 1000 Mg  Caps (Flaxseed (Linseed)) .... Once Daily 8)  Omega-3 350 Mg  Caps (Omega-3 Fatty Acids) .... Once Daily 9)  Calcium 500/d 500-125 Mg-Unit  Tabs (Calcium  Carbonate-Vitamin D) .... Once Daily 10)  Stress 500 B-Complex   Tabs (B Complex-C-Folic Acid) .... Once Daily 11)  Reglan 5 Mg  Tabs (Metoclopramide Hcl) .... Take One By Mouth Q Ac Supper and Hs  Allergies (verified): 1)  ! * Lamisil 2)  ! Morphine Sulfate Cr (Morphine Sulfate) 3)  ! Codeine Phosphate (Codeine Phosphate)  Past History:  Family History: Last updated: 2007-03-20 father died for anurysm mother has DM, HTn and breast CA Family History Breast cancer 1st degree relative <50 Family History Diabetes 1st degree relative Family History Hypertension  Social History: Last updated: 2007-03-20 Single Never Smoked  Risk Factors: Smoking Status: never (11/16/2009)  Past medical, surgical, family and social histories (including risk factors) reviewed for relevance to current acute and chronic problems.  Past Medical History: Reviewed history from 20-Mar-2007 and no changes required. Anemia-iron deficiency GERD Hypertension Osteoarthritis  Past Surgical History: Caesarean section Tubal ligation tubular pregnacy foot surgery  limited Hysterectomy  still has cervix! and ovaries  Family History: Reviewed history from 03/20/07 and no changes required. father died for anurysm mother has DM, HTn and breast CA Family History Breast cancer 1st degree relative <50 Family History Diabetes 1st degree relative Family History Hypertension  Social History: Reviewed history from 03/20/07 and no changes required. Single Never Smoked  Review of Systems  The patient denies anorexia, fever, weight loss, weight gain, vision loss, decreased hearing, hoarseness, chest pain, syncope, dyspnea on exertion, peripheral edema, prolonged cough, headaches, hemoptysis, abdominal pain, melena, hematochezia, severe indigestion/heartburn, hematuria, incontinence, genital sores, muscle weakness, suspicious skin lesions, transient blindness, difficulty walking, depression, unusual weight  change, abnormal bleeding, enlarged lymph nodes, angioedema, and breast masses.    Physical Exam  General:  AA femalewell-developed and well-nourished.   Head:  Normocephalic and atraumatic without obvious abnormalities. No apparent alopecia or balding. Eyes:  pupils equal and pupils round.   Ears:  R ear normal and L ear normal.   Nose:  no external deformity and no nasal discharge.   Neck:  No deformities, masses, or tenderness noted. Lungs:  Normal respiratory effort, chest expands symmetrically. Lungs are clear to auscultation, no crackles or wheezes. Heart:  Normal rate and regular rhythm. S1 and S2 normal without gallop, murmur, click, rub or other extra sounds.   Impression & Recommendations:  Problem # 1:  LEG PAIN, LEFT (ICD-729.5) Assessment New musculoskeletal pain in the lateral leg due to  bow leggedness and wearing high heal shoes recommned fitted tennis shoe for causal wear  Problem # 2:  HYPERTENSION (ICD-401.9) Assessment: Unchanged  Her updated medication list for this problem includes:    Ziac 5-6.25 Mg Tabs (Bisoprolol-hydrochlorothiazide) ..... Once daily    Lotrel 10-20 Mg Caps (Amlodipine besy-benazepril hcl) .Marland Kitchen... 1 once daily  BP today: 126/80 Prior BP: 130/80 (05/04/2009)  10 Yr Risk Heart Disease: 2 %  Labs Reviewed: K+: 4.2 (04/28/2009) Creat: : 0.9 (04/28/2009)   Chol: 160 (04/28/2009)   HDL: 65.00 (04/28/2009)  LDL: 84 (04/28/2009)   TG: 53.0 (04/28/2009)  Problem # 3:  GERD (ICD-530.81)  Her updated medication list for this problem includes:    Dexilant 60 Mg Cpdr (Dexlansoprazole) .Marland Kitchen... 1 once daily  Problem # 4:  ANEMIA-IRON DEFICIENCY (ICD-280.9) stop the iron   tah 2005 Her updated medication list for this problem includes:    Feosol 45 Mg Tabs (Iron) ..... Once daily  Hgb: 13.2 (11/03/2009)   Hct: 38.1 (11/03/2009)   Platelets: 265.0 (11/03/2009) RBC: 4.34 (11/03/2009)   RDW: 14.1 (11/03/2009)   WBC: 6.1 (11/03/2009) MCV: 87.9  (11/03/2009)   MCHC: 34.7 (11/03/2009) TSH: 0.88 (04/28/2009)  Complete Medication List: 1)  Ziac 5-6.25 Mg Tabs (Bisoprolol-hydrochlorothiazide) .... Once daily 2)  Therapeutic Multivitamin Tabs (Multiple vitamin) .... Once daily 3)  Lotrel 10-20 Mg Caps (Amlodipine besy-benazepril hcl) .Marland Kitchen.. 1 once daily 4)  Durabac 325-250-20-50 Mg Caps (Apap-salicyl-phenyltolox-caff) .... One by mouth q 6 hours as need for spasm 5)  Dexilant 60 Mg Cpdr (Dexlansoprazole) .Marland Kitchen.. 1 once daily 6)  Feosol 45 Mg Tabs (Iron) .... Once daily 7)  Bl Flax Seed Oil 1000 Mg Caps (Flaxseed (linseed)) .... Once daily 8)  Omega-3 350 Mg Caps (Omega-3 fatty acids) .... Once daily 9)  Calcium 500/d 500-125 Mg-unit Tabs (Calcium carbonate-vitamin d) .... Once daily 10)  Stress 500 B-complex Tabs (B complex-c-folic acid) .... Once daily 11)  Reglan 5 Mg Tabs (Metoclopramide hcl) .... Take one by mouth q ac supper and hs  Hypertension Assessment/Plan:      The patient's hypertensive risk group is category A: No risk factors and no target organ damage.  Her calculated 10 year risk of coronary heart disease is 2 %.  Today's blood pressure is 126/80.  Her blood pressure goal is < 140/90.  Patient Instructions: 1)  late sept of early oct  for cpx

## 2010-09-02 NOTE — Assessment & Plan Note (Signed)
Summary: 2 month f/u//alp   Vital Signs:  Patient profile:   50 year old female Height:      66 inches Weight:      194 pounds BMI:     31.43 Temp:     98.2 degrees F oral Pulse rate:   76 / minute Resp:     14 per minute BP sitting:   140 / 90  (left arm)  Vitals Entered By: Willy Eddy, LPN (August 09, 2010 9:44 AM) CC: roa mri- pt states she hasnt taken bp med all weekend Is Patient Diabetic? No   Primary Care Provider:  Stacie Glaze MD  CC:  roa mri- pt states she hasnt taken bp med all weekend.  History of Present Illness: monitering of OA with hx of Knee pain, neck pain and back pain muscle spasms worse in the neck head ache pattern stable blood pressure controlled  Preventive Screening-Counseling & Management  Alcohol-Tobacco     Smoking Status: never     Tobacco Counseling: not indicated; no tobacco use  Problems Prior to Update: 1)  Leg Pain, Left  (ICD-729.5) 2)  Preventive Health Care  (ICD-V70.0) 3)  Degeneration, Cervical Disc  (ICD-722.4) 4)  Muscle Spasm, Lumbar Region  (ICD-724.8) 5)  Tear, Medial Meniscus, Knee, Current  (ICD-836.0) 6)  Osteoarthrosis, Local Nos, Lower Leg  (ICD-715.36) 7)  Family History Diabetes 1st Degree Relative  (ICD-V18.0) 8)  Family History Breast Cancer 1st Degree Relative <50  (ICD-V16.3) 9)  Osteoarthritis  (ICD-715.90) 10)  Hypertension  (ICD-401.9) 11)  Gerd  (ICD-530.81) 12)  Anemia-iron Deficiency  (ICD-280.9)  Current Problems (verified): 1)  Leg Pain, Left  (ICD-729.5) 2)  Preventive Health Care  (ICD-V70.0) 3)  Degeneration, Cervical Disc  (ICD-722.4) 4)  Muscle Spasm, Lumbar Region  (ICD-724.8) 5)  Tear, Medial Meniscus, Knee, Current  (ICD-836.0) 6)  Osteoarthrosis, Local Nos, Lower Leg  (ICD-715.36) 7)  Family History Diabetes 1st Degree Relative  (ICD-V18.0) 8)  Family History Breast Cancer 1st Degree Relative <50  (ICD-V16.3) 9)  Osteoarthritis  (ICD-715.90) 10)  Hypertension  (ICD-401.9) 11)   Gerd  (ICD-530.81) 12)  Anemia-iron Deficiency  (ICD-280.9)  Medications Prior to Update: 1)  Ziac 5-6.25 Mg  Tabs (Bisoprolol-Hydrochlorothiazide) .... Once Daily 2)  Therapeutic Multivitamin   Tabs (Multiple Vitamin) .... Once Daily 3)  Lotrel 10-20 Mg Caps (Amlodipine Besy-Benazepril Hcl) .Marland Kitchen.. 1 Once Daily 4)  Durabac 325-250-20-50 Mg  Caps (Apap-Salicyl-Phenyltolox-Caff) .... One By Mouth Q 6 Hours As Need For Spasm 5)  Dexilant 60 Mg Cpdr (Dexlansoprazole) .Marland Kitchen.. 1 Once Daily 6)  Bl Flax Seed Oil 1000 Mg  Caps (Flaxseed (Linseed)) .... Once Daily 7)  Omega-3 350 Mg  Caps (Omega-3 Fatty Acids) .... Once Daily 8)  Calcium 500/d 500-125 Mg-Unit  Tabs (Calcium Carbonate-Vitamin D) .... Once Daily  Current Medications (verified): 1)  Ziac 5-6.25 Mg  Tabs (Bisoprolol-Hydrochlorothiazide) .... Once Daily 2)  Therapeutic Multivitamin   Tabs (Multiple Vitamin) .... Once Daily 3)  Lotrel 10-20 Mg Caps (Amlodipine Besy-Benazepril Hcl) .Marland Kitchen.. 1 Once Daily 4)  Durabac 325-250-20-50 Mg  Caps (Apap-Salicyl-Phenyltolox-Caff) .... One By Mouth Q 6 Hours As Need For Spasm 5)  Dexilant 60 Mg Cpdr (Dexlansoprazole) .Marland Kitchen.. 1 Once Daily 6)  Bl Flax Seed Oil 1000 Mg  Caps (Flaxseed (Linseed)) .... Once Daily 7)  Omega-3 350 Mg  Caps (Omega-3 Fatty Acids) .... Once Daily 8)  Calcium 500/d 500-125 Mg-Unit  Tabs (Calcium Carbonate-Vitamin D) .... Once Daily 9)  Cyclobenzaprine Hcl 15 Mg Xr24h-Cap (Cyclobenzaprine Hcl) .... One By Mouth Daily As Need For Neck Spams  Allergies (verified): 1)  ! * Lamisil 2)  ! Morphine Sulfate Cr (Morphine Sulfate) 3)  ! Codeine Phosphate (Codeine Phosphate)  Past History:  Family History: Last updated: March 30, 2007 father died for anurysm mother has DM, HTn and breast CA Family History Breast cancer 1st degree relative <50 Family History Diabetes 1st degree relative Family History Hypertension  Social History: Last updated: March 30, 2007 Single Never Smoked  Risk  Factors: Smoking Status: never (08/09/2010)  Past medical, surgical, family and social histories (including risk factors) reviewed, and no changes noted (except as noted below).  Past Medical History: Reviewed history from 2007-03-30 and no changes required. Anemia-iron deficiency GERD Hypertension Osteoarthritis  Past Surgical History: Reviewed history from 11/16/2009 and no changes required. Caesarean section Tubal ligation tubular pregnacy foot surgery  limited Hysterectomy  still has cervix! and ovaries  Family History: Reviewed history from 2007-03-30 and no changes required. father died for anurysm mother has DM, HTn and breast CA Family History Breast cancer 1st degree relative <50 Family History Diabetes 1st degree relative Family History Hypertension  Social History: Reviewed history from 2007/03/30 and no changes required. Single Never Smoked  Review of Systems  The patient denies anorexia, fever, weight loss, weight gain, vision loss, decreased hearing, hoarseness, chest pain, syncope, dyspnea on exertion, peripheral edema, prolonged cough, headaches, hemoptysis, abdominal pain, melena, hematochezia, severe indigestion/heartburn, hematuria, incontinence, genital sores, muscle weakness, suspicious skin lesions, transient blindness, difficulty walking, depression, unusual weight change, abnormal bleeding, enlarged lymph nodes, angioedema, and breast masses.    Physical Exam  General:  AA femalewell-developed and well-nourished.   Head:  Normocephalic and atraumatic without obvious abnormalities. No apparent alopecia or balding. Eyes:  pupils equal and pupils round.   Neck:  supple and decreased ROM.   Chest Wall:  no deformities and no tenderness.   Lungs:  normal respiratory effort and no wheezes.   Heart:  normal rate, regular rhythm, and no murmur.   Abdomen:  Bowel sounds positive,abdomen soft and non-tender without masses, organomegaly or hernias  noted. Msk:  joint tenderness and joint swelling.   Neurologic:  alert & oriented X3 and gait normal.     Impression & Recommendations:  Problem # 1:  DEGENERATION, CERVICAL DISC (ICD-722.4) Assessment Deteriorated ad flexeril for spasm and head aches  Problem # 2:  MUSCLE SPASM, LUMBAR REGION (ICD-724.8) Assessment: Deteriorated  Her updated medication list for this problem includes:    Durabac 325-250-20-50 Mg Caps (Apap-salicyl-phenyltolox-caff) ..... One by mouth q 6 hours as need for spasm    Cyclobenzaprine Hcl 15 Mg Xr24h-cap (Cyclobenzaprine hcl) ..... One by mouth daily as need for neck spams  Discussed use of moist heat or ice, modified activities, medications, and stretching/strengthening exercises. Back care instructions given. To be seen in 2 weeks if no improvement; sooner if worsening of symptoms.   Problem # 3:  TEAR, MEDIAL MENISCUS, KNEE, CURRENT (ICD-836.0) Assessment: Improved much improved  Problem # 4:  HYPERTENSION (ICD-401.9)  Her updated medication list for this problem includes:    Ziac 5-6.25 Mg Tabs (Bisoprolol-hydrochlorothiazide) ..... Once daily    Lotrel 10-20 Mg Caps (Amlodipine besy-benazepril hcl) .Marland Kitchen... 1 once daily  BP today: 150/100 Prior BP: 154/84 (06/30/2010)  Prior 10 Yr Risk Heart Disease: 6 % (06/30/2010)  Labs Reviewed: K+: 4.3 (05/12/2010) Creat: : 0.9 (05/12/2010)   Chol: 170 (05/12/2010)   HDL: 55.90 (05/12/2010)   LDL: 100 (  05/12/2010)   TG: 70.0 (05/12/2010)  Complete Medication List: 1)  Ziac 5-6.25 Mg Tabs (Bisoprolol-hydrochlorothiazide) .... Once daily 2)  Therapeutic Multivitamin Tabs (Multiple vitamin) .... Once daily 3)  Lotrel 10-20 Mg Caps (Amlodipine besy-benazepril hcl) .Marland Kitchen.. 1 once daily 4)  Durabac 325-250-20-50 Mg Caps (Apap-salicyl-phenyltolox-caff) .... One by mouth q 6 hours as need for spasm 5)  Dexilant 60 Mg Cpdr (Dexlansoprazole) .Marland Kitchen.. 1 once daily 6)  Bl Flax Seed Oil 1000 Mg Caps (Flaxseed (linseed))  .... Once daily 7)  Omega-3 350 Mg Caps (Omega-3 fatty acids) .... Once daily 8)  Calcium 500/d 500-125 Mg-unit Tabs (Calcium carbonate-vitamin d) .... Once daily 9)  Cyclobenzaprine Hcl 15 Mg Xr24h-cap (Cyclobenzaprine hcl) .... One by mouth daily as need for neck spams  Patient Instructions: 1)  Please schedule a follow-up appointment in oct for cpx Prescriptions: DURABAC 325-250-20-50 MG  CAPS (APAP-SALICYL-PHENYLTOLOX-CAFF) one by mouth q 6 hours as need for spasm  #30 x 2   Entered and Authorized by:   Stacie Glaze MD   Signed by:   Stacie Glaze MD on 08/09/2010   Method used:   Electronically to        [facility] [phone]* (retail)       [address]       [place], [place]  [address]       Ph: [phone]       Fax: [phone]   RxID:   [id number] CYCLOBENZAPRINE HCL 15 MG XR24H-CAP (CYCLOBENZAPRINE HCL) one by mouth daily as need for neck spams  #30 x 5   Entered and Authorized by:   Stacie Glaze MD   Signed by:   Stacie Glaze MD on 08/09/2010   Method used:   Electronically to        [facility] [phone]* (retail)       [address]       [place], [place]  [address]       Ph: [phone]       Fax: [phone]   RxID:   [id number]    Orders Added: 1)  Est. Patient Level IV [10932]

## 2010-09-20 ENCOUNTER — Other Ambulatory Visit: Payer: Self-pay | Admitting: Internal Medicine

## 2010-09-23 ENCOUNTER — Other Ambulatory Visit: Payer: Self-pay | Admitting: Internal Medicine

## 2010-10-20 LAB — POCT RAPID STREP A (OFFICE): Streptococcus, Group A Screen (Direct): NEGATIVE

## 2010-11-24 ENCOUNTER — Other Ambulatory Visit: Payer: Self-pay | Admitting: Obstetrics and Gynecology

## 2010-12-17 NOTE — Discharge Summary (Signed)
NAMESHRUTI, Gina Padilla                        ACCOUNT NO.:  [id number]   MEDICAL RECORD NO.:  [id number]                   PATIENT TYPE:  INP   LOCATION:  9311                                 FACILITY:  [facility]   PHYSICIAN:  Maxie Better, M.D.            DATE OF BIRTH:  29-Apr-1961   DATE OF ADMISSION:  03/15/2004  DATE OF DISCHARGE:                                 DISCHARGE SUMMARY   ADMISSION DIAGNOSES:  1. Adenomyosis.  2. Menorrhagia.  3. Chronic hypertension.   DISCHARGE DIAGNOSIS:  1. Adenomyosis.  2. Intramural fibroid.  3. Menorrhagia.  4. Chronic hypertension.   PROCEDURE:  Supracervical hysterectomy, right partial salpingectomy, lysis  of adhesions.   HOSPITAL COURSE:  The patient was admitted to [facility] where she  underwent exploratory laparotomy, supracervical hysterectomy, right partial  salpingectomy, lysis of adhesions.  Please see the dictated operative note  for the specific findings at her surgery.  The patient had an uncomplicated  postoperative course.  Her CBC on postoperative day #1 showed a hemoglobin  of 11.4, white count of 6.3.  By postoperative day #2 the patient was  passing flatus, tolerating a regular diet, her pain was well controlled with  oral medications.  Her incision showed no erythema, induration, or exudate.  She has staples in place.  Final pathology was consistent with extensive  adenomyosis and intramural fibroid, a benign right fallopian tube.  The  patient was deemed well to be discharged home.   DISPOSITION:  Home.   CONDITION:  Stable.   DISCHARGE MEDICATIONS:  1. Mepergan Fortis #30 one to two tablets q.4-6h. p.r.n. pain.  2. Motrin 800 mg one p.o. q.6h. p.r.n. pain #30 two refills.   FOLLOW-UP APPOINTMENT:  At [facility] for staple removal on Friday and  for regular postoperative care in 4-6 weeks.   DISCHARGE INSTRUCTIONS:  Discharge instructions were given to the patient;  specifically, call for  temperature greater than or equal to 100.4; nothing  per vagina for 4-6 weeks; no heavy lifting or driving for 2 weeks; call for  several abdominal pain, nausea, vomiting, increased incisional pain,  drainage or redness from the incision site; no straining with bowel  movements.                                               Maxie Better, M.D.    [place]/MEDQ  D:  03/17/2004  T:  03/17/2004  Job:  [id number]

## 2010-12-17 NOTE — Op Note (Signed)
[facility]  Patient:    Gina Padilla, Gina Padilla Visit Number: [id number] MRN: [id number]          Service Type: DSU Location: [facility] Attending Physician:  Maxie Better Dictated by:   Sheria Lang. Cherly Hensen, M.D. Proc. Date: 06/29/01 Admit Date:  06/29/2001                             Operative Report  PREOPERATIVE DIAGNOSES:       1. Menorrhagia.                               2. Uterine fibroids.  PROCEDURE:                    1. Diagnostic hysteroscopy.                               2. Hysteroscopic resection of submucosal                                  fibroid.                               3. Dilation and curettage.  POSTOPERATIVE DIAGNOSES:      1. Menorrhagia.                               2. Uterine fibroids.  ANESTHESIA:                   General.  SURGEON:                      Sheronette A. Cousins, M.D.  DESCRIPTION OF PROCEDURE:     Under adequate general anesthesia, the patient was placed in a dorsal lithotomy position. She was sterilely prepped and draped in the usual fashion. The bladder was catheterized for a large amount of urine. Bimanual examination revealed an anteverted uterus, about 8 weeks size. No adnexal masses could be appreciated. A bivalve speculum was placed in the vagina. A single-tooth tenaculum was placed on the anterior lip of the cervix. The cervix was then serially dilated to #25 [facility] dilator. A diagnostic hysteroscope was then introduced. The cavity was inspected. Both tubal ostia could be seen. The posterior wall of the uterine cavity was noted to be raised. The anterior wall was inspected. There were no polypoid lesions or evidence of fibroids anteriorly. At the end of the cervical canal was without any lesions. The diagnostic hysteroscope was then removed. The cervix was then further serially dilated up to #31 [facility] dilator and a resectoscope was then inserted. Using the double loop, the posterior raised  area was resected. The resectoscope was then removed, the cavity then curetted, reinsertion of the resectoscope showed some other small areas of raised concern for possible submucosal fibroid, and resection was then performed again. The cavity was once again curetted. Inspection of the uterine cavity showed an even distribution of the lining circumferentially and at this point, decision was then made to remove all instruments from the vagina. Specimens labeled endometrial curetting with submucosal fibroid was sent to pathology. Fluid deficit was 90 cc.  Complication was none. The patient tolerated the procedure well and was transferred to the recovery room in stable condition. Dictated by:   Sheria Lang. Cherly Hensen, M.D. Attending Physician:  Maxie Better DD:  06/29/01 TD:  06/30/01 Job: [id number]

## 2010-12-17 NOTE — H&P (Signed)
Gina Padilla, Gina Padilla                        ACCOUNT NO.:  [id number]   MEDICAL RECORD NO.:  [id number]                   PATIENT TYPE:  INP   LOCATION:  NA                                   FACILITY:  [facility]   PHYSICIAN:  Maxie Better, M.D.            DATE OF BIRTH:  03/19/1961   DATE OF ADMISSION:  03/15/2004  DATE OF DISCHARGE:                                HISTORY & PHYSICAL   CHIEF COMPLAINT:  Severe cramps, heavy cycles, uterine fibroids.   HISTORY OF PRESENT ILLNESS:  The patient is a 50 year old gravida 6, para 3-  0-3-3 married black female with history of tubal ligation, last menstrual  period of February 13, 2004, who is now being admitted for total abdominal  hysterectomy.  The patient's history is notable for hysteroscopic resection  of submucosal fibroid in November 2002.  She has complaint of normal cycles  for a year after her surgery but began having very heavy vaginal bleeding  with associated anemia.  The patient declined Jearld Adjutant IUD.  Ultrasound on  October 01, 2003 showed a uterus that measured 12.9 x 7.3 x 8.5 cm, multiple  fibroids, the largest was close to the submucosa.  Ovaries are normal.  Endometrial biopsy was done on Dec 17, 2003 and showed proliferative  endometrium with glandular and stromal breakdown.  The patient was  interested in uterine artery embolization and therefore was sent for  consultation regarding this.  The MRI, however, for evaluation for the  uterine artery embolization showed severe adenomyosis and therefore Dr.  Denny Levy of radiology at [facility] recommended a hysterectomy.  After discussion with her husband, the patient has opted to proceed with  hysterectomy.   ALLERGIES:  MORPHINE AND CODEINE.   CURRENT MEDICATIONS:  1. Ziac 10 mg daily.  2. Xanax as needed.  3. Norvasc 10 mg by mouth daily.  4. Zantac as needed.   PAST MEDICAL HISTORY:  1. Chronic hypertension.  2. Gastroesophageal reflux disease.   PAST SURGICAL  HISTORY:  1. Cesarean section times three.  2. Dilation and curettage.  3. Hysteroscopic resection of submucosal fibroid.  4. Right bunionectomy.  5. Left hammertoe surgery times two.  6. Tubal ligation.   OBSTETRICAL HISTORY:  Cesarean section times three.   FAMILY HISTORY:  Positive for prostate cancer, cousin age 49 with breast  cancer.  No ovarian cancer.  Three uncles with apparently some colon cancer  history and prostate cancer.   REVIEW OF SYSTEMS:  Negative except for history of present illness.   PHYSICAL EXAMINATION:  GENERAL:  The patient is a well-developed, well-  nourished black female in no acute distress.  VITAL SIGNS:  Blood pressure 120/78, temperature 98.8, weight 192 pounds.  SKIN:  No lesions.  HEENT:  Anicteric sclerae, pink conjunctivae, oropharynx negative.  HEART:  Regular rate and rhythm without murmur.  LUNGS:  Clear to auscultation.  NECK:  Supple.  Thyroid not palpable.  NODES:  No supraclavicular, axillary or inguinal nodes palpable.  BACK:  No CVA tenderness.  BREASTS:  Soft, nontender, no palpable mass, upper nipples without  discharge.  ABDOMEN:  Soft, nondistended, vertical and transverse scar, no organomegaly.  PELVIC:  Vulva showed no lesions, vagina had no discharge, cervix was round  os, uterus was anteverted, irregular, about 10 to 12 weeks size.  Adnexa no  palpable mass.  RECTAL:  Exam deferred.   IMPRESSION:  1. Menorrhagia.  2. Severe dysmenorrhea secondary to adenomyosis.  3. Iron deficiency anemia.   PLAN:  Admission.  Total abdominal hysterectomy with preservation of ovarian  function.  Antibiotic prophylaxis.  Antiembolic stockings. Routine admission  labs.  Risks of surgery were reviewed including but not limited to  infection, bleeding, injury to surrounding organ structures such as the  bladder, bowel, ureter, major blood vessels, organs.  Present concept of  ovarian preservation was reviewed including possible need for  surgery in the  future for ovarian cancer, cyst formation, fistula formation, internal scar  tissue which may cause pelvic pain, bowel obstruction in the future,  possible need for blood transfusion, risks of blood transfusion HIV,  transmission, hepatitis transmission, acute reaction discussed.  Postoperative care and criteria for discharge were reviewed.  All questions  were answered.                                               Maxie Better, M.D.    /MEDQ  D:  02/25/2004  T:  02/25/2004  Job:  [id number]

## 2010-12-17 NOTE — Op Note (Signed)
NAMEJAYRA, CHOYCE                        ACCOUNT NO.:  [id number]   MEDICAL RECORD NO.:  [id number]                   PATIENT TYPE:  INP   LOCATION:  9399                                 FACILITY:  [facility]   PHYSICIAN:  Maxie Better, M.D.            DATE OF BIRTH:  May 28, 1961   DATE OF PROCEDURE:  03/15/2004  DATE OF DISCHARGE:                                 OPERATIVE REPORT   PREOPERATIVE DIAGNOSES:  1. Adenomyosis.  2. Menorrhagia.   PROCEDURE:  1. Exploratory laparotomy.  2. Supracervical hysterectomy.  3. Right partial salpingectomy.  4. Lysis of adhesion.   POSTOPERATIVE DIAGNOSES:  1. Adenomyosis.  2. Menorrhagia.  3. Anterior abdominal wall adhesion.   ANESTHESIA:  General.   SURGEON:  Maxie Better, M.D.   ASSISTANT:  Genia Del, M.D.   FINDINGS:  A 10 weeks' size, globular uterus, normal ovaries bilateral,  normal appendix.  Surgical separation of fallopian tubes bilaterally.  Pelvic adhesions, bladder densely adherent to the lower uterine segment and  over the cervix.  Normal upper abdomen to palpation.  Omental adhesion to  the anterior abdominal wall.  Deep cul-de-sac, no pelvic endometriosis.   DESCRIPTION OF PROCEDURE:  Under adequate general anesthesia, the patient  was placed in the supine position.  She was sterilely prepped and draped in  the usual fashion.  An indwelling Foley catheter was sterilely placed.  A  Pfannenstiel skin incision was then made, carried down to the rectus fascia.  Rectus fascia was incised in the midline and extended bilaterally.  The  rectus fascia was then bluntly and sharply dissected off the rectus muscle  in superior and inferior fashion.  The rectus muscle was split in the  midline; the parietal peritoneum was entered sharply and extended superiorly  and inferiorly.  On entering the pelvis, it was notable for the bulky, 10  weeks' size, globular uterus.  There were peritoneal adhesions, particularly  involving the bladder and the left anterior broad ligament.  The upper  abdomen was inspected.  Omental adhesions adherent to the anterior abdominal  wall to the level of the umbilicus and a little bit to the right of that was  noted.  The appendix was noted to have been normal.  The liver edge and  kidney was palpably normal.  The bowel was packed upwardly.  Initial attempt  at placing the [facility] was deferred after the blades were noted to be too  shallow for the pelvis.  Therefore, the long Kellys were then utilized to  bilaterally grasp the uteroovarian ligaments and on traction, brought the  uterus up into the field.  The adhesions around the left round ligament were  then lysed.  The bladder peritoneum was then opened high and adhesions  sharply dissected and the bladder attempted to be displaced inferiorly;  however, it was densely adherent and was not coming down, even with some  gentle sharp dissection.  Attention was then  turned to the round ligaments  which were bilaterally suture ligated with 0 Vicryl suture and severed with  cautery.  The anterior leaf was then further opened.  The posterior leaf of  the broad ligament was then bilaterally opened.  The uterosacral ligaments  were then bilaterally clamped, cut, suture ligated with 0 Vicryl, free tied  with 0 Vicryl bilaterally.  The uterine vessels were then skeletonized, and  the uterine vessels were then bilaterally doubly clamped, cut, and suture  ligated with 0 Vicryl x 2.  Reinspection of the bladder area, there was a  bleed on the right side which had been encountered in the process of trying  to dissect off the bladder.  This vessel was then initially hemostasis 3-0  Vicryl figure-of-eight suture, and inspection for bleeding was recurred  inferiorly.  A second 3-0 Vicryl figure-of-eight suture was then placed.  The cervix was inspected and noted to be very elongated.  The bladder was  brought up on traction and areas were  looked for potential window for  dissecting this bladder off the cervix, and attempts were unsuccessful.  A  decision was then made to proceed with a supracervical hysterectomy in order  to decrease the chance of a bladder injury.  A coned out area of the stump  of the cervix was then performed.  The base was then cauterized and the  defect closed with 0 Vicryl figure-of-eight sutures and the stump overlying  with 0 Vicryl running lock stitch.  Small bleeding along the areas where the  bladder had been attempted removed, dissected, was cauterized.  The pedicles  of the ovarian vessels were identified and on the left, there was bleeding  along the peritoneal edge which was then clamped and suture ligated with 0  Vicryl.  The round ligaments were then tied to the ovarian pedicles  bilaterally.  The abdomen was then copiously irrigated, suctioned, and  ureters identified to be peristalsis and deep in the pelvis.  The posterior  cul-de-sac was inspected.  No endometriosis seen.  Good hemostasis then  noted.  The bowel packings were then removed.  The omentum was then placed  on traction, and cautery was then utilized to remove its attachments on the  anterior abdominal wall.  Inspection showed otherwise good hemostasis.  Reinspection of the pelvis again showed good hemostasis.  On inspection of  the right adnexa, it was noted that the fallopian tube had a small  attachment, hanging freely with risk for torsion, at which time cautery was  then used to remove its small attachment, and the portion of the right  fallopian tube was then sent.  Remaining aspects of the pelvis were  otherwise unremarkable.  The parietal peritoneum was then closed.  The  undersurface of the rectus fascia was inspected, four small bleeders  cauterized.  The rectus fascia was closed with 0 Vicryl x 2.  The  subcutaneous areas irrigated, small bleeders cauterized, and the skin approximated using Ethicon staple.  Specimen  was uterus without cervical  attachment, portion of the right fallopian tube.  Estimated blood loss was  150 mL.  Intraoperative fluid was 1500 mL crystalloid.  Urine output was 300  mL clear yellow urine.  Sponge and instrument counts x 2 was correct.  Complication was none.  The patient tolerated the procedure well, was  transferred to the recovery room in stable condition.  Maxie Better, M.D.    [place]/MEDQ  D:  03/15/2004  T:  03/15/2004  Job:  [id number]

## 2011-01-24 ENCOUNTER — Telehealth: Payer: Self-pay | Admitting: *Deleted

## 2011-01-24 NOTE — Telephone Encounter (Signed)
Pt has appt scheduled for 02/04/11 for ear pain and swollen feet, declined to see another provider.  Requested to be worked in sooner if possible.

## 2011-01-25 NOTE — Telephone Encounter (Signed)
earlier appointment given

## 2011-01-26 ENCOUNTER — Encounter: Payer: Self-pay | Admitting: Internal Medicine

## 2011-01-26 ENCOUNTER — Ambulatory Visit (INDEPENDENT_AMBULATORY_CARE_PROVIDER_SITE_OTHER): Payer: 59 | Admitting: Internal Medicine

## 2011-01-26 VITALS — BP 142/94 | HR 76 | Temp 98.2°F | Resp 16 | Ht 66.0 in | Wt 194.0 lb

## 2011-01-26 DIAGNOSIS — D509 Iron deficiency anemia, unspecified: Secondary | ICD-10-CM

## 2011-01-26 DIAGNOSIS — I10 Essential (primary) hypertension: Secondary | ICD-10-CM

## 2011-01-26 DIAGNOSIS — J309 Allergic rhinitis, unspecified: Secondary | ICD-10-CM

## 2011-01-26 DIAGNOSIS — H60399 Other infective otitis externa, unspecified ear: Secondary | ICD-10-CM

## 2011-01-26 DIAGNOSIS — H6090 Unspecified otitis externa, unspecified ear: Secondary | ICD-10-CM

## 2011-01-26 DIAGNOSIS — Z9109 Other allergy status, other than to drugs and biological substances: Secondary | ICD-10-CM

## 2011-01-26 MED ORDER — MOMETASONE FUROATE 50 MCG/ACT NA SUSP: 2.0000 | Freq: Every day | NASAL | Status: DC

## 2011-01-26 MED ORDER — NEOMYCIN-POLYMYXIN-HC 3.5-10000-1 OT SOLN: 3.0000 [drp] | Freq: Three times a day (TID) | OTIC | Status: AC

## 2011-01-26 NOTE — Progress Notes (Signed)
Subjective:    Patient ID: Gina Padilla, female    DOB: August 19, 1960, 50 y.o.   MRN: [id number]  HPI Patient presents with painful right ear.  Has been going on for more than one month she also has allergic rhinitis with vasomotor rhinitis as well as itchy watery eyes.  She has not come in to see [place] because she recently had a sister diagnosed with head and neck cancer that began with a sore year this has caused her to become greatly concerned and she has been avoiding doctors office during this time also she has been overeating and has gained approximately 20 pounds.  This is resulted in end of day edema in her lower extremities and worsening control of her blood pressure.  History reviewed. No pertinent past medical history. History reviewed. No pertinent past surgical history.  reports that she has never smoked. She does not have any smokeless tobacco history on file. She reports that she drinks about 1.2 ounces of alcohol per week. Her drug history not on file. family history includes Peripheral vascular disease in her father. Allergies  Allergen Reactions  . Codeine Phosphate   . Morphine Sulfate       Review of Systems  Constitutional: Negative for activity change, appetite change and fatigue.  HENT: Negative for ear pain, congestion, neck pain, postnasal drip and sinus pressure.   Eyes: Positive for pain and itching. Negative for redness and visual disturbance.  Respiratory: Negative for cough, shortness of breath and wheezing.   Gastrointestinal: Negative for abdominal pain and abdominal distention.  Genitourinary: Negative for dysuria, frequency and menstrual problem.  Musculoskeletal: Negative for myalgias, joint swelling and arthralgias.  Skin: Negative for rash and wound.  Neurological: Negative for dizziness, weakness and headaches.  Hematological: Negative for adenopathy. Does not bruise/bleed easily.  Psychiatric/Behavioral: Negative for sleep disturbance and  decreased concentration.       Objective:   Physical Exam  Constitutional: She is oriented to person, place, and time. She appears well-developed and well-nourished. No distress.  HENT:  Head: Normocephalic and atraumatic.  Mouth/Throat: Oropharynx is clear and moist.       Erythema and the other canal turbinates erythematous posterior cobblestoning  Eyes: Conjunctivae and EOM are normal. Pupils are equal, round, and reactive to light.  Neck: Normal range of motion. Neck supple. No JVD present. No tracheal deviation present. No thyromegaly present.  Cardiovascular: Normal rate, regular rhythm, normal heart sounds and intact distal pulses.   No murmur heard. Pulmonary/Chest: Effort normal and breath sounds normal. She has no wheezes. She exhibits no tenderness.  Abdominal: Soft. Bowel sounds are normal.  Musculoskeletal: Normal range of motion. She exhibits no edema and no tenderness.  Lymphadenopathy:    She has no cervical adenopathy.  Neurological: She is alert and oriented to person, place, and time. She has normal reflexes. No cranial nerve deficit.  Skin: Skin is warm and dry. She is not diaphoretic.  Psychiatric: She has a normal mood and affect. Her behavior is normal.          Assessment & Plan:  Patient has mild to otitis externa we'll treat with Corticosporin otic drops she has eustachian tube dysfunction we will treat with Nasonex and over-the-counter Zyrtec.  We reassured her that we do not see any signs of head and neck cancer with her specifically there is no adenopathy or oropharynx lesion is visible she has gained weight and that this is stress reaction.  The edema in her lower extremities  is secondary to her 20 pound weight gain.Marland Kitchen

## 2011-01-27 ENCOUNTER — Encounter: Payer: Self-pay | Admitting: Internal Medicine

## 2011-02-04 ENCOUNTER — Ambulatory Visit: Payer: Self-pay | Admitting: Internal Medicine

## 2011-02-14 ENCOUNTER — Other Ambulatory Visit: Payer: Self-pay | Admitting: Obstetrics and Gynecology

## 2011-02-14 DIAGNOSIS — Z1231 Encounter for screening mammogram for malignant neoplasm of breast: Secondary | ICD-10-CM

## 2011-02-17 ENCOUNTER — Ambulatory Visit
Admission: RE | Admit: 2011-02-17 | Discharge: 2011-02-17 | Disposition: A | Discharge status: Home / Self Care | Payer: 59 | Source: Ambulatory Visit | Attending: Obstetrics and Gynecology | Admitting: Obstetrics and Gynecology

## 2011-02-17 DIAGNOSIS — Z1231 Encounter for screening mammogram for malignant neoplasm of breast: Secondary | ICD-10-CM

## 2011-02-21 ENCOUNTER — Other Ambulatory Visit: Payer: Self-pay | Admitting: Obstetrics and Gynecology

## 2011-02-21 DIAGNOSIS — R928 Other abnormal and inconclusive findings on diagnostic imaging of breast: Secondary | ICD-10-CM

## 2011-02-25 ENCOUNTER — Ambulatory Visit
Admission: RE | Admit: 2011-02-25 | Discharge: 2011-02-25 | Disposition: A | Discharge status: Home / Self Care | Payer: 59 | Source: Ambulatory Visit | Attending: Obstetrics and Gynecology | Admitting: Obstetrics and Gynecology

## 2011-02-25 DIAGNOSIS — R928 Other abnormal and inconclusive findings on diagnostic imaging of breast: Secondary | ICD-10-CM

## 2011-05-16 ENCOUNTER — Other Ambulatory Visit: Payer: Self-pay

## 2011-05-16 LAB — DIFFERENTIAL
Basophils Absolute: 0.1
Basophils Relative: 1
Eosinophils Absolute: 0
Eosinophils Relative: 0
Lymphocytes Relative: 19
Lymphs Abs: 1.4
Monocytes Absolute: 0.5
Monocytes Relative: 7
Neutro Abs: 5.3
Neutrophils Relative %: 73

## 2011-05-16 LAB — CBC
HCT: 35.5 — ABNORMAL LOW
Hemoglobin: 12.2
MCHC: 34.4
MCV: 86.5
Platelets: 331
RBC: 4.11
RDW: 13.2
WBC: 7.2

## 2011-05-16 LAB — COMPREHENSIVE METABOLIC PANEL
ALT: 19
AST: 32
Albumin: 3.8
Alkaline Phosphatase: 60
BUN: 7
CO2: 27
Calcium: 8.9
Chloride: 107
Creatinine, Ser: 0.93
GFR calc Af Amer: >60
GFR calc non Af Amer: >60
Glucose, Bld: 107 — ABNORMAL HIGH
Potassium: 4.2
Sodium: 137
Total Bilirubin: 0.7
Total Protein: 7.4

## 2011-05-16 LAB — POCT CARDIAC MARKERS
CKMB, poc: <1 — ABNORMAL LOW
CKMB, poc: <1 — ABNORMAL LOW
Myoglobin, poc: 134
Myoglobin, poc: 91.4
Operator id: 234501
Operator id: 234501
Troponin i, poc: <0.05
Troponin i, poc: <0.05

## 2011-05-23 ENCOUNTER — Encounter: Payer: Self-pay | Admitting: Internal Medicine

## 2011-05-25 ENCOUNTER — Telehealth: Payer: Self-pay | Admitting: Family Medicine

## 2011-05-25 DIAGNOSIS — Z0000 Encounter for general adult medical examination without abnormal findings: Secondary | ICD-10-CM

## 2011-05-25 NOTE — Telephone Encounter (Signed)
Gina Padilla - Per the new [facility] policy, we cannot put the patients on the lab schedule there. I have taken this patient off, but I still need the lab orders put in the system for [facility]. The patient is coming in 11/1 to [place]. The order is for: cpx labs (v70.0) Thanks!

## 2011-06-02 ENCOUNTER — Other Ambulatory Visit: Payer: Self-pay

## 2011-06-07 ENCOUNTER — Other Ambulatory Visit (INDEPENDENT_AMBULATORY_CARE_PROVIDER_SITE_OTHER): Payer: 59

## 2011-06-07 DIAGNOSIS — Z0000 Encounter for general adult medical examination without abnormal findings: Secondary | ICD-10-CM

## 2011-06-07 LAB — BASIC METABOLIC PANEL
BUN: 10 mg/dL (ref 6–23)
CO2: 29 mEq/L (ref 19–32)
Calcium: 9.2 mg/dL (ref 8.4–10.5)
Chloride: 105 mEq/L (ref 96–112)
Creatinine, Ser: 1.1 mg/dL (ref 0.4–1.2)
GFR: 71.86 mL/min (ref >60.00)
Glucose, Bld: 112 mg/dL — ABNORMAL HIGH (ref 70–99)
Potassium: 4.3 mEq/L (ref 3.5–5.1)
Sodium: 140 mEq/L (ref 135–145)

## 2011-06-07 LAB — CBC WITH DIFFERENTIAL/PLATELET
Basophils Absolute: 0 10*3/uL (ref 0.0–0.1)
Basophils Relative: 0.6 % (ref 0.0–3.0)
Eosinophils Absolute: 0.1 10*3/uL (ref 0.0–0.7)
Eosinophils Relative: 0.8 % (ref 0.0–5.0)
HCT: 38.8 % (ref 36.0–46.0)
Hemoglobin: 13.2 g/dL (ref 12.0–15.0)
Lymphocytes Relative: 26.4 % (ref 12.0–46.0)
Lymphs Abs: 1.8 10*3/uL (ref 0.7–4.0)
MCHC: 34 g/dL (ref 30.0–36.0)
MCV: 90.9 fl (ref 78.0–100.0)
Monocytes Absolute: 0.5 10*3/uL (ref 0.1–1.0)
Monocytes Relative: 7.1 % (ref 3.0–12.0)
Neutro Abs: 4.3 10*3/uL (ref 1.4–7.7)
Neutrophils Relative %: 65.1 % (ref 43.0–77.0)
Platelets: 263 10*3/uL (ref 150.0–400.0)
RBC: 4.27 Mil/uL (ref 3.87–5.11)
RDW: 12.7 % (ref 11.5–14.6)
WBC: 6.7 10*3/uL (ref 4.5–10.5)

## 2011-06-07 LAB — HEPATIC FUNCTION PANEL
ALT: 16 U/L (ref 0–35)
AST: 18 U/L (ref 0–37)
Albumin: 4 g/dL (ref 3.5–5.2)
Alkaline Phosphatase: 53 U/L (ref 39–117)
Bilirubin, Direct: 0 mg/dL (ref 0.0–0.3)
Total Bilirubin: 0.5 mg/dL (ref 0.3–1.2)
Total Protein: 7.5 g/dL (ref 6.0–8.3)

## 2011-06-07 LAB — LIPID PANEL
Cholesterol: 177 mg/dL (ref 0–200)
HDL: 60.1 mg/dL (ref >39.00)
LDL Cholesterol: 107 mg/dL — ABNORMAL HIGH (ref 0–99)
Total CHOL/HDL Ratio: 3
Triglycerides: 51 mg/dL (ref 0.0–149.0)
VLDL: 10.2 mg/dL (ref 0.0–40.0)

## 2011-06-07 LAB — TSH: TSH: 1.42 u[IU]/mL (ref 0.35–5.50)

## 2011-06-09 ENCOUNTER — Encounter: Payer: Self-pay | Admitting: Internal Medicine

## 2011-06-09 ENCOUNTER — Ambulatory Visit (INDEPENDENT_AMBULATORY_CARE_PROVIDER_SITE_OTHER): Payer: 59 | Admitting: Internal Medicine

## 2011-06-09 VITALS — BP 120/82 | HR 72 | Temp 98.3°F | Resp 16 | Ht 66.0 in | Wt 195.0 lb

## 2011-06-09 DIAGNOSIS — I10 Essential (primary) hypertension: Secondary | ICD-10-CM

## 2011-06-09 DIAGNOSIS — D509 Iron deficiency anemia, unspecified: Secondary | ICD-10-CM

## 2011-06-09 DIAGNOSIS — K219 Gastro-esophageal reflux disease without esophagitis: Secondary | ICD-10-CM

## 2011-06-09 DIAGNOSIS — R739 Hyperglycemia, unspecified: Secondary | ICD-10-CM

## 2011-06-09 DIAGNOSIS — Z0000 Encounter for general adult medical examination without abnormal findings: Secondary | ICD-10-CM

## 2011-06-09 NOTE — Progress Notes (Signed)
  Subjective:    Patient ID: Gina Padilla, female    DOB: 12/08/1960, 50 y.o.   MRN: [id number]  HPI CPX She is doing  well she has a history of iron deficiency anemia hypertension and GERD   Review of Systems  Constitutional: Negative for activity change, appetite change and fatigue.  HENT: Negative for ear pain, congestion, neck pain, postnasal drip and sinus pressure.   Eyes: Negative for redness and visual disturbance.  Respiratory: Negative for cough, shortness of breath and wheezing.   Gastrointestinal: Negative for abdominal pain and abdominal distention.  Genitourinary: Negative for dysuria, frequency and menstrual problem.  Musculoskeletal: Negative for myalgias, joint swelling and arthralgias.  Skin: Negative for rash and wound.  Neurological: Negative for dizziness, weakness and headaches.  Hematological: Negative for adenopathy. Does not bruise/bleed easily.  Psychiatric/Behavioral: Negative for sleep disturbance and decreased concentration.  Please adjust all future scheduled administration times for this order to these times:  Past Medical History  Diagnosis Date  . Anemia   . GERD (gastroesophageal reflux disease)   . Hypertension   . Arthritis    Past Surgical History  Procedure Date  . Cesarean section   . Tubal ligation   . Foot surgery   . Abdominal hysterectomy     reports that she has never smoked. She does not have any smokeless tobacco history on file. She reports that she drinks about 1.2 ounces of alcohol per week. Her drug history not on file. family history includes Anuerysm in her father; Breast cancer in her mother; Diabetes in her mother; Hypertension in her mother; and Peripheral vascular disease in her father. Allergies  Allergen Reactions  . Codeine Phosphate   . Morphine Sulfate        Objective:   Physical Exam  Constitutional: She is oriented to person, place, and time. She appears well-developed and well-nourished. No distress.    HENT:  Head: Normocephalic and atraumatic.  Right Ear: External ear normal.  Left Ear: External ear normal.  Nose: Nose normal.  Mouth/Throat: Oropharynx is clear and moist.  Eyes: Conjunctivae and EOM are normal. Pupils are equal, round, and reactive to light.  Neck: Normal range of motion. Neck supple. No JVD present. No tracheal deviation present. No thyromegaly present.  Cardiovascular: Normal rate, regular rhythm, normal heart sounds and intact distal pulses.   No murmur heard. Pulmonary/Chest: Effort normal and breath sounds normal. She has no wheezes. She exhibits no tenderness.  Abdominal: Soft. Bowel sounds are normal.  Musculoskeletal: Normal range of motion. She exhibits no edema and no tenderness.  Lymphadenopathy:    She has no cervical adenopathy.  Neurological: She is alert and oriented to person, place, and time. She has normal reflexes. No cranial nerve deficit.  Skin: Skin is warm and dry. She is not diaphoretic.  Psychiatric: She has a normal mood and affect. Her behavior is normal.          Assessment & Plan:   This is a routine physical examination for this healthy  Female. Reviewed all health maintenance protocols including mammography colonoscopy bone density and reviewed appropriate screening labs. Her immunization history was reviewed as well as her current medications and allergies refills of her chronic medications were given and the plan for yearly health maintenance was discussed all orders and referrals were made as appropriate.

## 2011-06-09 NOTE — Patient Instructions (Signed)
The patient is instructed to continue all medications as prescribed. Schedule followup with check out clerk upon leaving the clinic  

## 2011-10-31 ENCOUNTER — Other Ambulatory Visit: Payer: Self-pay | Admitting: Internal Medicine

## 2011-11-24 ENCOUNTER — Telehealth: Payer: Self-pay | Admitting: Internal Medicine

## 2011-11-24 NOTE — Telephone Encounter (Signed)
Pt is out of town and left her meds at home. Pt is req that her amLODipine-benazepril (LOTREL) 10-20 MG per capsule and  bisoprolol-hydrochlorothiazide (ZIAC) 5-6.25 MG per tablet be called in to [facility] [address] [place], [place] [phone].

## 2011-11-25 ENCOUNTER — Other Ambulatory Visit: Payer: Self-pay | Admitting: *Deleted

## 2011-11-25 MED ORDER — BISOPROLOL-HYDROCHLOROTHIAZIDE 5-6.25 MG PO TABS: 1.0000 | ORAL_TABLET | Freq: Every day | ORAL | Status: DC

## 2011-11-25 MED ORDER — AMLODIPINE BESY-BENAZEPRIL HCL 10-20 MG PO CAPS: 1.0000 | ORAL_CAPSULE | Freq: Every day | ORAL | Status: DC

## 2011-11-25 NOTE — Telephone Encounter (Signed)
E-scribed to pharmacy

## 2011-11-30 ENCOUNTER — Other Ambulatory Visit (INDEPENDENT_AMBULATORY_CARE_PROVIDER_SITE_OTHER): Payer: 59

## 2011-11-30 DIAGNOSIS — R7309 Other abnormal glucose: Secondary | ICD-10-CM

## 2011-11-30 DIAGNOSIS — R739 Hyperglycemia, unspecified: Secondary | ICD-10-CM

## 2011-11-30 LAB — BASIC METABOLIC PANEL
BUN: 8 mg/dL (ref 6–23)
CO2: 28 mEq/L (ref 19–32)
Calcium: 9.3 mg/dL (ref 8.4–10.5)
Chloride: 106 mEq/L (ref 96–112)
Creatinine, Ser: 0.9 mg/dL (ref 0.4–1.2)
GFR: 82.5 mL/min (ref >60.00)
Glucose, Bld: 110 mg/dL — ABNORMAL HIGH (ref 70–99)
Potassium: 4.9 mEq/L (ref 3.5–5.1)
Sodium: 143 mEq/L (ref 135–145)

## 2011-12-07 ENCOUNTER — Encounter: Payer: Self-pay | Admitting: Internal Medicine

## 2011-12-07 ENCOUNTER — Ambulatory Visit (INDEPENDENT_AMBULATORY_CARE_PROVIDER_SITE_OTHER): Payer: 59 | Admitting: Internal Medicine

## 2011-12-07 VITALS — BP 140/86 | HR 72 | Temp 98.1°F | Resp 16 | Ht 66.0 in | Wt 188.0 lb

## 2011-12-07 DIAGNOSIS — I10 Essential (primary) hypertension: Secondary | ICD-10-CM

## 2011-12-07 DIAGNOSIS — R739 Hyperglycemia, unspecified: Secondary | ICD-10-CM

## 2011-12-07 NOTE — Patient Instructions (Signed)
Look up "paleo diet" and Avoid sweeteners except stevia  and raw sugar

## 2011-12-07 NOTE — Progress Notes (Signed)
  Subjective:    Patient ID: Gina Padilla, female    DOB: 1961-04-18, 51 y.o.   MRN: [id number]  HPI  Gina Padilla is a healthy African American female who presents for followup of hypertension and a history of gastroesophageal reflux we discussed the role that gastroesophageal reflux and diet plate to get.  She also has a history of iron deficiency anemia and will require CBC differential should also have a basic metabolic panel drawn to monitor her blood pressure control.    Review of Systems  Constitutional: Negative for activity change, appetite change and fatigue.  HENT: Negative for ear pain, congestion, neck pain, postnasal drip and sinus pressure.   Eyes: Negative for redness and visual disturbance.  Respiratory: Negative for cough, shortness of breath and wheezing.   Gastrointestinal: Negative for abdominal pain and abdominal distention.  Genitourinary: Negative for dysuria, frequency and menstrual problem.  Musculoskeletal: Negative for myalgias, joint swelling and arthralgias.  Skin: Negative for rash and wound.  Neurological: Negative for dizziness, weakness and headaches.  Hematological: Negative for adenopathy. Does not bruise/bleed easily.  Psychiatric/Behavioral: Negative for disturbed wake/sleep cycle and decreased concentration.       Objective:   Physical Exam  Nursing note and vitals reviewed. Constitutional: She is oriented to person, place, and time. She appears well-developed and well-nourished. No distress.  HENT:  Head: Normocephalic and atraumatic.  Right Ear: External ear normal.  Left Ear: External ear normal.  Nose: Nose normal.  Mouth/Throat: Oropharynx is clear and moist.  Eyes: Conjunctivae normal and EOM are normal. Pupils are equal, round, and reactive to light.  Neck: Normal range of motion. Neck supple. No JVD present. No tracheal deviation present. No thyromegaly present.  Cardiovascular: Normal rate, regular rhythm, normal heart sounds and  intact distal pulses.   No murmur heard. Pulmonary/Chest: Effort normal and breath sounds normal. She has no wheezes. She exhibits no tenderness.  Abdominal: Soft. Bowel sounds are normal.  Musculoskeletal: Normal range of motion. She exhibits no edema and no tenderness.  Lymphadenopathy:    She has no cervical adenopathy.  Neurological: She is alert and oriented to person, place, and time. She has normal reflexes. No cranial nerve deficit.  Skin: Skin is warm and dry. She is not diaphoretic.  Psychiatric: She has a normal mood and affect. Her behavior is normal.          Assessment & Plan:  Diet discussed Total occlusions in inducing gastroesophageal reflux.  Monitoring iron deficiency anemia monitoring basic metabolic panel  stable hypertension

## 2012-02-21 ENCOUNTER — Other Ambulatory Visit: Payer: Self-pay | Admitting: Obstetrics and Gynecology

## 2012-02-21 DIAGNOSIS — Z1231 Encounter for screening mammogram for malignant neoplasm of breast: Secondary | ICD-10-CM

## 2012-02-27 ENCOUNTER — Ambulatory Visit: Payer: 59

## 2012-03-05 ENCOUNTER — Ambulatory Visit: Payer: 59

## 2012-03-07 ENCOUNTER — Ambulatory Visit
Admission: RE | Admit: 2012-03-07 | Discharge: 2012-03-07 | Disposition: A | Discharge status: Home / Self Care | Payer: 59 | Source: Ambulatory Visit | Attending: Obstetrics and Gynecology | Admitting: Obstetrics and Gynecology

## 2012-03-07 DIAGNOSIS — Z1231 Encounter for screening mammogram for malignant neoplasm of breast: Secondary | ICD-10-CM

## 2012-03-12 ENCOUNTER — Ambulatory Visit (INDEPENDENT_AMBULATORY_CARE_PROVIDER_SITE_OTHER): Payer: 59 | Admitting: Internal Medicine

## 2012-03-12 ENCOUNTER — Encounter: Payer: Self-pay | Admitting: Internal Medicine

## 2012-03-12 VITALS — BP 134/80 | HR 72 | Temp 98.2°F | Resp 16 | Ht 66.0 in | Wt 192.0 lb

## 2012-03-12 DIAGNOSIS — K219 Gastro-esophageal reflux disease without esophagitis: Secondary | ICD-10-CM

## 2012-03-12 DIAGNOSIS — H6980 Other specified disorders of Eustachian tube, unspecified ear: Secondary | ICD-10-CM

## 2012-03-12 MED ORDER — AZELASTINE-FLUTICASONE 137-50 MCG/ACT NA SUSP: 1.0000 | Freq: Two times a day (BID) | NASAL | Status: DC

## 2012-03-12 MED ORDER — RANITIDINE HCL 300 MG PO TABS: 300.0000 mg | ORAL_TABLET | Freq: Every day | ORAL | Status: DC

## 2012-03-12 NOTE — Progress Notes (Signed)
Subjective:    Patient ID: Gina Padilla, female    DOB: [birth year], 51 y.o.   MRN: [id number]  HPI Follow up for HTN GERD  flared with pattern influenced by diet Eating late and over-eating Hx of iron deficiency anemia  Review of Systems  Constitutional: Negative for activity change, appetite change and fatigue.  HENT: Negative for ear pain, congestion, neck pain, postnasal drip and sinus pressure.   Eyes: Negative for redness and visual disturbance.  Respiratory: Negative for cough, shortness of breath and wheezing.   Gastrointestinal: Negative for abdominal pain and abdominal distention.       Gerd  Genitourinary: Negative for dysuria, frequency and menstrual problem.  Musculoskeletal: Negative for myalgias, joint swelling and arthralgias.  Skin: Negative for rash and wound.  Neurological: Negative for dizziness, weakness and headaches.  Hematological: Negative for adenopathy. Does not bruise/bleed easily.  Psychiatric/Behavioral: Negative for disturbed wake/sleep cycle and decreased concentration.   Past Medical History  Diagnosis Date  . Anemia   . GERD (gastroesophageal reflux disease)   . Hypertension   . Arthritis     History   Social History  . Marital Status: Widowed    Spouse Name: N/A    Number of Children: N/A  . Years of Education: N/A   Occupational History  . Not on file.   Social History Main Topics  . Smoking status: Never Smoker   . Smokeless tobacco: Not on file  . Alcohol Use: 1.2 oz/week    2 Glasses of wine per week  . Drug Use: Not on file  . Sexually Active: Yes   Other Topics Concern  . Not on file   Social History Narrative  . No narrative on file    Past Surgical History  Procedure Date  . Cesarean section   . Tubal ligation   . Foot surgery   . Abdominal hysterectomy     Family History  Problem Relation Age of Onset  . Peripheral vascular disease Father   . Anuerysm Father   . Diabetes Mother   . Hypertension Mother    . Breast cancer Mother     Allergies  Allergen Reactions  . Codeine Phosphate   . Morphine Sulfate     Current Outpatient Prescriptions on File Prior to Visit  Medication Sig Dispense Refill  . amLODipine-benazepril (LOTREL) 10-20 MG per capsule Take 1 capsule by mouth daily.  30 capsule  0  . bisoprolol-hydrochlorothiazide (ZIAC) 5-6.25 MG per tablet Take 1 tablet by mouth daily.  30 tablet  0  . calcium gluconate 500 MG tablet Take 500 mg by mouth daily.        . Flaxseed, Linseed, (BL FLAX SEED OIL) 1000 MG CAPS Take by mouth daily.        . Azelastine-Fluticasone (DYMISTA) 137-50 MCG/ACT SUSP Place 1 spray into the nose 2 (two) times daily.  23 g  11  . ranitidine (ZANTAC) 300 MG tablet Take 1 tablet (300 mg total) by mouth at bedtime.  30 tablet  11    BP 134/80  Pulse 72  Temp 98.2 F (36.8 C)  Resp 16  Ht 5\' 6"  (1.676 m)  Wt 192 lb (87.091 kg)  BMI 30.99 kg/m2        Objective:   Physical Exam  Nursing note reviewed. Constitutional: She is oriented to person, place, and time. She appears well-developed and well-nourished. No distress.  HENT:  Head: Normocephalic and atraumatic.  Right Ear: External ear normal.  Left  Ear: External ear normal.  Nose: Nose normal.  Mouth/Throat: Oropharynx is clear and moist.  Eyes: Conjunctivae and EOM are normal. Pupils are equal, round, and reactive to light.  Neck: Normal range of motion. Neck supple. No JVD present. No tracheal deviation present. No thyromegaly present.  Cardiovascular: Normal rate, regular rhythm, normal heart sounds and intact distal pulses.   No murmur heard. Pulmonary/Chest: Effort normal and breath sounds normal. She has no wheezes. She exhibits no tenderness.  Abdominal: Soft. Bowel sounds are normal.  Musculoskeletal: Normal range of motion. She exhibits no edema and no tenderness.  Lymphadenopathy:    She has no cervical adenopathy.  Neurological: She is alert and oriented to person, place, and  time. She has normal reflexes. No cranial nerve deficit.  Skin: Skin is warm and dry. She is not diaphoretic.  Psychiatric: She has a normal mood and affect. Her behavior is normal.          Assessment & Plan:  Reason is persistent GERD and cannot afford the PPI that was prescribed before for use generic Zantac 300 mg by mouth each bedtime patient has eustachian tube dysfunction we used dymista to 1 spray in each nostril twice daily.  The patient has mild to moderate hypertension on cardiac amlodipine and is stable on her current medications.

## 2012-03-12 NOTE — Patient Instructions (Signed)
Past Medical History  Diagnosis Date  . Anemia   . GERD (gastroesophageal reflux disease)   . Hypertension   . Arthritis     History   Social History  . Marital Status: Widowed    Spouse Name: N/A    Number of Children: N/A  . Years of Education: N/A   Occupational History  . Not on file.   Social History Main Topics  . Smoking status: Never Smoker   . Smokeless tobacco: Not on file  . Alcohol Use: 1.2 oz/week    2 Glasses of wine per week  . Drug Use: Not on file  . Sexually Active: Yes   Other Topics Concern  . Not on file   Social History Narrative  . No narrative on file    Past Surgical History  Procedure Date  . Cesarean section   . Tubal ligation   . Foot surgery   . Abdominal hysterectomy     Family History  Problem Relation Age of Onset  . Peripheral vascular disease Father   . Anuerysm Father   . Diabetes Mother   . Hypertension Mother   . Breast cancer Mother     Allergies  Allergen Reactions  . Codeine Phosphate   . Morphine Sulfate     The patient is instructed to continue all medications as prescribed. Schedule followup with check out clerk upon leaving the clinic

## 2012-04-28 ENCOUNTER — Other Ambulatory Visit: Payer: Self-pay | Admitting: Internal Medicine

## 2012-07-04 ENCOUNTER — Other Ambulatory Visit: Payer: Self-pay | Admitting: Internal Medicine

## 2012-07-06 ENCOUNTER — Other Ambulatory Visit: Payer: Self-pay | Admitting: Internal Medicine

## 2012-07-23 ENCOUNTER — Ambulatory Visit: Payer: 59 | Admitting: Internal Medicine

## 2012-07-23 ENCOUNTER — Ambulatory Visit (INDEPENDENT_AMBULATORY_CARE_PROVIDER_SITE_OTHER): Payer: 59 | Admitting: Family Medicine

## 2012-07-23 ENCOUNTER — Encounter: Payer: Self-pay | Admitting: Family Medicine

## 2012-07-23 VITALS — BP 140/90 | Temp 98.1°F | Wt 200.0 lb

## 2012-07-23 DIAGNOSIS — M25561 Pain in right knee: Secondary | ICD-10-CM

## 2012-07-23 DIAGNOSIS — M25569 Pain in unspecified knee: Secondary | ICD-10-CM

## 2012-07-23 MED ORDER — DICLOFENAC SODIUM 1 % TD GEL: 4.0000 g | Freq: Four times a day (QID) | TRANSDERMAL | Status: DC

## 2012-07-23 NOTE — Progress Notes (Signed)
  Subjective:    Patient ID: Gina Padilla, female    DOB: 04-27-1961, 51 y.o.   MRN: [id number]  HPI Patient seen as a work in with right knee pain. Off and on for years. No recent injury. She's had some knee pain bilaterally but especially right knee. Occasional early morning stiffness. She's had some chronic mild edema. No locking or giving way. She avoids nonsteroidals because of hypertension history apparently been sensitive to nonsteroidals in the past. No regular exercise   Review of Systems  Musculoskeletal: Negative for myalgias and gait problem.       Objective:   Physical Exam  Constitutional: She appears well-developed and well-nourished.  Cardiovascular: Normal rate and regular rhythm.   Pulmonary/Chest: Effort normal and breath sounds normal. No respiratory distress. She has no wheezes. She has no rales.  Musculoskeletal:       Right knee reveals full range of motion. No effusion. No warmth. No erythema. No ecchymosis. No localized tenderness. Ligament testing is all normal          Assessment & Plan:  Right knee pain. Differential is mild osteoarthritis, chondromalacia patella,meniscal. Avoid regular nonsteroidals because of her hypertension history. We've recommended regular exercise to strength quadriceps muscles, for example cycling. Topical Voltaren gel  3-4 times daily

## 2012-07-23 NOTE — Patient Instructions (Addendum)
Try to establish regular exercise for knee such as cycling and knee extensions with weights- as discussed.

## 2012-09-25 ENCOUNTER — Other Ambulatory Visit: Payer: Self-pay | Admitting: *Deleted

## 2012-09-25 ENCOUNTER — Ambulatory Visit: Payer: 59 | Admitting: Internal Medicine

## 2012-09-25 ENCOUNTER — Ambulatory Visit (INDEPENDENT_AMBULATORY_CARE_PROVIDER_SITE_OTHER): Payer: 59 | Admitting: Internal Medicine

## 2012-09-25 ENCOUNTER — Encounter: Payer: Self-pay | Admitting: Internal Medicine

## 2012-09-25 VITALS — BP 125/80 | HR 72 | Temp 98.3°F | Resp 16 | Ht 66.0 in | Wt 198.0 lb

## 2012-09-25 DIAGNOSIS — I10 Essential (primary) hypertension: Secondary | ICD-10-CM

## 2012-09-25 DIAGNOSIS — M1710 Unilateral primary osteoarthritis, unspecified knee: Secondary | ICD-10-CM

## 2012-09-25 MED ORDER — AMLODIPINE BESY-BENAZEPRIL HCL 10-20 MG PO CAPS: 1.0000 | ORAL_CAPSULE | Freq: Every day | ORAL | Status: DC

## 2012-09-25 MED ORDER — METHYLPREDNISOLONE ACETATE 40 MG/ML IJ SUSP: 40.0000 mg | Freq: Once | INTRAMUSCULAR | Status: DC

## 2012-09-25 MED ORDER — BISOPROLOL-HYDROCHLOROTHIAZIDE 5-6.25 MG PO TABS: 1.0000 | ORAL_TABLET | Freq: Every day | ORAL | Status: DC

## 2012-09-25 NOTE — Patient Instructions (Signed)
The patient is instructed to continue all medications as prescribed. Schedule followup with check out clerk upon leaving the clinic  

## 2012-09-25 NOTE — Progress Notes (Signed)
  Subjective:    Patient ID: Gina Padilla, female    DOB: Feb 23, 1961, 52 y.o.   MRN: [id number]  HPI Presents for acute on hx of chronic knee pain HTn stable   Review of Systems  Constitutional: Negative for activity change, appetite change and fatigue.  HENT: Negative for ear pain, congestion, neck pain, postnasal drip and sinus pressure.   Eyes: Negative for redness and visual disturbance.  Respiratory: Negative for cough, shortness of breath and wheezing.   Gastrointestinal: Negative for abdominal pain and abdominal distention.  Genitourinary: Negative for dysuria, frequency and menstrual problem.  Musculoskeletal: Negative for myalgias, joint swelling and arthralgias.  Skin: Negative for rash and wound.  Neurological: Negative for dizziness, weakness and headaches.  Hematological: Negative for adenopathy. Does not bruise/bleed easily.  Psychiatric/Behavioral: Negative for sleep disturbance and decreased concentration.       Objective:   Physical Exam  Constitutional: She is oriented to person, place, and time. She appears well-developed and well-nourished. No distress.  HENT:  Head: Normocephalic and atraumatic.  Right Ear: External ear normal.  Left Ear: External ear normal.  Nose: Nose normal.  Mouth/Throat: Oropharynx is clear and moist.  Eyes: Conjunctivae and EOM are normal. Pupils are equal, round, and reactive to light.  Neck: Normal range of motion. Neck supple. No JVD present. No tracheal deviation present. No thyromegaly present.  Cardiovascular: Normal rate, regular rhythm, normal heart sounds and intact distal pulses.   No murmur heard. Pulmonary/Chest: Effort normal and breath sounds normal. She has no wheezes. She exhibits no tenderness.  Abdominal: Soft. Bowel sounds are normal.  Musculoskeletal: Normal range of motion. She exhibits no edema and no tenderness.  Lymphadenopathy:    She has no cervical adenopathy.  Neurological: She is alert and oriented to  person, place, and time. She has normal reflexes. No cranial nerve deficit.  Skin: Skin is warm and dry. She is not diaphoretic.  Psychiatric: She has a normal mood and affect. Her behavior is normal.          Assessment & Plan:   Informed consent obtained and the patient's left knee was prepped with betadine. Local anesthesia was obtained with topical spray. Then 40 mg of Depo-Medrol and 1/2 cc of lidocaine was injected into the joint space. The patient tolerated the procedure without complications. Post injection care discussed with patient. Blood pressure was stable on current medications Weight management reveiwed

## 2012-09-28 ENCOUNTER — Ambulatory Visit: Payer: 59 | Admitting: Internal Medicine

## 2013-03-15 ENCOUNTER — Other Ambulatory Visit: Payer: Self-pay

## 2013-03-15 DIAGNOSIS — Z1231 Encounter for screening mammogram for malignant neoplasm of breast: Secondary | ICD-10-CM

## 2013-03-19 ENCOUNTER — Telehealth: Payer: Self-pay | Admitting: Internal Medicine

## 2013-03-19 DIAGNOSIS — Z0000 Encounter for general adult medical examination without abnormal findings: Secondary | ICD-10-CM

## 2013-03-19 NOTE — Telephone Encounter (Signed)
Pt would like to go to [facility] for her [facility] labs in Jan. Can you put the order in?

## 2013-03-19 NOTE — Telephone Encounter (Signed)
done

## 2013-04-08 ENCOUNTER — Ambulatory Visit
Admission: RE | Admit: 2013-04-08 | Discharge: 2013-04-08 | Disposition: A | Discharge status: Home / Self Care | Payer: 59 | Source: Ambulatory Visit

## 2013-04-08 DIAGNOSIS — Z1231 Encounter for screening mammogram for malignant neoplasm of breast: Secondary | ICD-10-CM

## 2013-04-09 ENCOUNTER — Other Ambulatory Visit: Payer: Self-pay | Admitting: Obstetrics and Gynecology

## 2013-04-09 DIAGNOSIS — R928 Other abnormal and inconclusive findings on diagnostic imaging of breast: Secondary | ICD-10-CM

## 2013-04-24 ENCOUNTER — Ambulatory Visit
Admission: RE | Admit: 2013-04-24 | Discharge: 2013-04-24 | Disposition: A | Discharge status: Home / Self Care | Payer: 59 | Source: Ambulatory Visit | Attending: Obstetrics and Gynecology | Admitting: Obstetrics and Gynecology

## 2013-04-24 DIAGNOSIS — R928 Other abnormal and inconclusive findings on diagnostic imaging of breast: Secondary | ICD-10-CM

## 2013-04-28 ENCOUNTER — Other Ambulatory Visit: Payer: Self-pay | Admitting: Internal Medicine

## 2013-05-27 ENCOUNTER — Ambulatory Visit: Payer: 59 | Admitting: Internal Medicine

## 2013-06-14 ENCOUNTER — Other Ambulatory Visit: Payer: Self-pay | Admitting: Internal Medicine

## 2013-08-05 ENCOUNTER — Encounter: Payer: 59 | Admitting: Internal Medicine

## 2013-08-05 ENCOUNTER — Other Ambulatory Visit (INDEPENDENT_AMBULATORY_CARE_PROVIDER_SITE_OTHER): Payer: 59

## 2013-08-05 ENCOUNTER — Other Ambulatory Visit: Payer: Self-pay | Admitting: Internal Medicine

## 2013-08-05 DIAGNOSIS — Z0000 Encounter for general adult medical examination without abnormal findings: Secondary | ICD-10-CM

## 2013-08-05 LAB — LIPID PANEL
Cholesterol: 168 mg/dL (ref 0–200)
HDL: 56.8 mg/dL (ref >39.00)
LDL Cholesterol: 100 mg/dL — ABNORMAL HIGH (ref 0–99)
Total CHOL/HDL Ratio: 3
Triglycerides: 56 mg/dL (ref 0.0–149.0)
VLDL: 11.2 mg/dL (ref 0.0–40.0)

## 2013-08-05 LAB — HEPATIC FUNCTION PANEL
ALT: 17 U/L (ref 0–35)
AST: 21 U/L (ref 0–37)
Albumin: 4.1 g/dL (ref 3.5–5.2)
Alkaline Phosphatase: 55 U/L (ref 39–117)
Bilirubin, Direct: 0.1 mg/dL (ref 0.0–0.3)
Total Bilirubin: 0.3 mg/dL (ref 0.3–1.2)
Total Protein: 7.5 g/dL (ref 6.0–8.3)

## 2013-08-05 LAB — CBC WITH DIFFERENTIAL/PLATELET
Basophils Absolute: 0 10*3/uL (ref 0.0–0.1)
Basophils Relative: 0.5 % (ref 0.0–3.0)
Eosinophils Absolute: 0.1 10*3/uL (ref 0.0–0.7)
Eosinophils Relative: 0.9 % (ref 0.0–5.0)
HCT: 40.3 % (ref 36.0–46.0)
Hemoglobin: 13.5 g/dL (ref 12.0–15.0)
Lymphocytes Relative: 23.2 % (ref 12.0–46.0)
Lymphs Abs: 1.6 10*3/uL (ref 0.7–4.0)
MCHC: 33.4 g/dL (ref 30.0–36.0)
MCV: 88.6 fl (ref 78.0–100.0)
Monocytes Absolute: 0.5 10*3/uL (ref 0.1–1.0)
Monocytes Relative: 6.8 % (ref 3.0–12.0)
Neutro Abs: 4.9 10*3/uL (ref 1.4–7.7)
Neutrophils Relative %: 68.6 % (ref 43.0–77.0)
Platelets: 287 10*3/uL (ref 150.0–400.0)
RBC: 4.54 Mil/uL (ref 3.87–5.11)
RDW: 13.5 % (ref 11.5–14.6)
WBC: 7.1 10*3/uL (ref 4.5–10.5)

## 2013-08-05 LAB — BASIC METABOLIC PANEL
BUN: 8 mg/dL (ref 6–23)
CO2: 26 mEq/L (ref 19–32)
Calcium: 8.9 mg/dL (ref 8.4–10.5)
Chloride: 105 mEq/L (ref 96–112)
Creatinine, Ser: 1 mg/dL (ref 0.4–1.2)
GFR: 78.05 mL/min (ref >60.00)
Glucose, Bld: 110 mg/dL — ABNORMAL HIGH (ref 70–99)
Potassium: 3.9 mEq/L (ref 3.5–5.1)
Sodium: 139 mEq/L (ref 135–145)

## 2013-08-05 LAB — TSH: TSH: 1.91 u[IU]/mL (ref 0.35–5.50)

## 2013-08-12 ENCOUNTER — Ambulatory Visit (INDEPENDENT_AMBULATORY_CARE_PROVIDER_SITE_OTHER): Payer: 59 | Admitting: Internal Medicine

## 2013-08-12 ENCOUNTER — Encounter: Payer: Self-pay | Admitting: Internal Medicine

## 2013-08-12 VITALS — BP 122/80 | HR 76 | Temp 98.0°F | Resp 16 | Ht 66.0 in | Wt 196.0 lb

## 2013-08-12 DIAGNOSIS — Z0000 Encounter for general adult medical examination without abnormal findings: Secondary | ICD-10-CM

## 2013-08-12 DIAGNOSIS — M5030 Other cervical disc degeneration, unspecified cervical region: Secondary | ICD-10-CM

## 2013-08-12 DIAGNOSIS — R7309 Other abnormal glucose: Secondary | ICD-10-CM

## 2013-08-12 DIAGNOSIS — R7303 Prediabetes: Secondary | ICD-10-CM

## 2013-08-12 NOTE — Patient Instructions (Signed)
you will have a call for the MRI to be scheduled in the next few days

## 2013-08-12 NOTE — Progress Notes (Signed)
Subjective:    Patient ID: Gina Padilla, female    DOB: 30-Apr-1961, 53 y.o.   MRN: [id number]  HPI CPX  Radicular pain radiating into the left shoulder.  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  HENT: Negative for congestion, ear pain, postnasal drip and sinus pressure.   Eyes: Negative for redness and visual disturbance.  Respiratory: Negative for cough, shortness of breath and wheezing.   Gastrointestinal: Negative for abdominal pain and abdominal distention.  Genitourinary: Negative for dysuria, frequency and menstrual problem.  Musculoskeletal: Negative for arthralgias, joint swelling, myalgias and neck pain.  Skin: Negative for rash and wound.  Neurological: Negative for dizziness, weakness and headaches.  Hematological: Negative for adenopathy. Does not bruise/bleed easily.  Psychiatric/Behavioral: Negative for sleep disturbance and decreased concentration.   Past Medical History  Diagnosis Date  . Anemia   . GERD (gastroesophageal reflux disease)   . Hypertension   . Arthritis     History   Social History  . Marital Status: Widowed    Spouse Name: N/A    Number of Children: N/A  . Years of Education: N/A   Occupational History  . Not on file.   Social History Main Topics  . Smoking status: Never Smoker   . Smokeless tobacco: Not on file  . Alcohol Use: 1.2 oz/week    2 Glasses of wine per week  . Drug Use: Not on file  . Sexual Activity: Yes   Other Topics Concern  . Not on file   Social History Narrative  . No narrative on file    Past Surgical History  Procedure Laterality Date  . Cesarean section    . Tubal ligation    . Foot surgery    . Abdominal hysterectomy      Family  History  Problem Relation Age of Onset  . Peripheral vascular disease Father   . Anuerysm Father   . Diabetes Mother   . Hypertension Mother   . Breast cancer Mother     Allergies  Allergen Reactions  . Codeine Phosphate   . Morphine Sulfate     Current Outpatient Prescriptions on File Prior to Visit  Medication Sig Dispense Refill  . amLODipine-benazepril (LOTREL) 10-20 MG per capsule TAKE 1 CAPSULE DAILY  90 capsule  0  . Azelastine-Fluticasone (DYMISTA) 137-50 MCG/ACT SUSP Place 1 spray into the nose 2 (two) times daily.  23 g  11  . bisoprolol-hydrochlorothiazide (ZIAC) 5-6.25 MG per tablet TAKE 1 TABLET DAILY  90 tablet  2  . calcium gluconate 500 MG tablet Take 500 mg by mouth daily.        Marland Kitchen ESTRACE VAGINAL 0.1 MG/GM vaginal cream Place 2 g vaginally as needed. Per Dr Garwin Brothers, OB/GYN      . Flaxseed, Linseed, (BL FLAX SEED OIL) 1000 MG CAPS Take by mouth daily.        . metroNIDAZOLE (FLAGYL) 500 MG tablet Take 500 mg by mouth as needed.       . ranitidine (ZANTAC) 300 MG tablet take 1 tablet by mouth at bedtime  30 tablet  11   No current facility-administered medications on file prior to visit.    BP 122/80  Pulse 76  Temp(Src) 98 F (  36.7 C)  Resp 16  Ht 5\' 6"  (1.676 m)  Wt 196 lb (88.905 kg)  BMI 31.65 kg/m2        Objective:   Physical Exam  Nursing note and vitals reviewed. Constitutional: She is oriented to person, place, and time. She appears well-developed and well-nourished. No distress.  HENT:  Head: Normocephalic and atraumatic.  Right Ear: External ear normal.  Left Ear: External ear normal.  Nose: Nose normal.  Mouth/Throat: Oropharynx is clear and moist.  Eyes: Conjunctivae and EOM are normal. Pupils are equal, round, and reactive to light.  Neck: Normal range of motion. Neck supple. No JVD present. No tracheal deviation present. No thyromegaly present.  Cardiovascular: Normal rate, regular rhythm, normal heart sounds and intact distal  pulses.   No murmur heard. Pulmonary/Chest: Effort normal and breath sounds normal. She has no wheezes. She exhibits no tenderness.  Abdominal: Soft. Bowel sounds are normal.  Musculoskeletal: Normal range of motion. She exhibits no edema and no tenderness.  Lymphadenopathy:    She has no cervical adenopathy.  Neurological: She is alert and oriented to person, place, and time. She has normal reflexes. No cranial nerve deficit.  Skin: Skin is warm and dry. She is not diaphoretic.  Psychiatric: She has a normal mood and affect. Her behavior is normal.          Assessment & Plan:   This is a routine physical examination for this healthy  Female. Reviewed all health maintenance protocols including mammography colonoscopy bone density and reviewed appropriate screening labs. Her immunization history was reviewed as well as her current medications and allergies refills of her chronic medications were given and the plan for yearly health maintenance was discussed all   orders and referrals were made as appropriate.   the nature of her recurrent pain and the prior MRI showing multilevel disc disease with foraminal encroachment I believe that a repeat the MRI to see if there is progression of the disease since this was done in 2011 and the significant nature of her recurrent pain

## 2013-08-12 NOTE — Progress Notes (Signed)
CINA not available today  

## 2013-08-21 ENCOUNTER — Ambulatory Visit
Admission: RE | Admit: 2013-08-21 | Discharge: 2013-08-21 | Disposition: A | Discharge status: Home / Self Care | Payer: 59 | Source: Ambulatory Visit | Attending: Internal Medicine | Admitting: Internal Medicine

## 2013-08-21 DIAGNOSIS — M5030 Other cervical disc degeneration, unspecified cervical region: Secondary | ICD-10-CM

## 2013-09-12 ENCOUNTER — Other Ambulatory Visit: Payer: Self-pay | Admitting: Internal Medicine

## 2013-10-08 ENCOUNTER — Telehealth: Payer: Self-pay | Admitting: Internal Medicine

## 2013-10-08 DIAGNOSIS — M47812 Spondylosis without myelopathy or radiculopathy, cervical region: Secondary | ICD-10-CM

## 2013-10-08 NOTE — Telephone Encounter (Signed)
Pt had mri about a month ago. Pt needs results

## 2013-10-09 NOTE — Telephone Encounter (Signed)
Had spondylosis or "slipping of disc"  The disc protrusion is the same as prior ( not surgical) Would benefit form PT and traction  Refer for traction therapy.

## 2013-10-09 NOTE — Telephone Encounter (Signed)
Pt stated that she has had PT and didn't help much.  She said that she was given exercises to do at her desk and she does those exercises but she would prefer to have muscle relaxer if ok with Dr Arnoldo Morale.  PT will be too much out of pocket.  Please advise

## 2013-10-11 NOTE — Telephone Encounter (Signed)
Left message for pt to call back  °

## 2013-10-11 NOTE — Telephone Encounter (Signed)
Flexeril 5 mg tid prn  Number 30 Different PT have different success. Out patient PT like [facility] are not facility based and copay are not what the [facility] was.

## 2013-10-18 MED ORDER — CYCLOBENZAPRINE HCL 5 MG PO TABS: 5.0000 mg | ORAL_TABLET | Freq: Three times a day (TID) | ORAL | Status: DC | PRN

## 2013-10-18 NOTE — Telephone Encounter (Signed)
Left message for pt to call back  °

## 2013-10-18 NOTE — Telephone Encounter (Signed)
Pt aware, rx sent in electronically, referral order placed for [facility]

## 2014-02-10 ENCOUNTER — Ambulatory Visit (INDEPENDENT_AMBULATORY_CARE_PROVIDER_SITE_OTHER): Payer: 59 | Admitting: Internal Medicine

## 2014-02-10 ENCOUNTER — Encounter: Payer: Self-pay | Admitting: Internal Medicine

## 2014-02-10 ENCOUNTER — Telehealth: Payer: Self-pay | Admitting: Internal Medicine

## 2014-02-10 VITALS — BP 124/88 | HR 71 | Wt 196.0 lb

## 2014-02-10 DIAGNOSIS — I10 Essential (primary) hypertension: Secondary | ICD-10-CM

## 2014-02-10 MED ORDER — AMLODIPINE BESY-BENAZEPRIL HCL 5-40 MG PO CAPS: 1.0000 | ORAL_CAPSULE | Freq: Every day | ORAL | Status: DC

## 2014-02-10 NOTE — Progress Notes (Signed)
Pre visit review using our clinic review tool, if applicable. No additional management support is needed unless otherwise documented below in the visit note. 

## 2014-02-10 NOTE — Progress Notes (Signed)
Subjective:    Patient ID: Gina Padilla, female    DOB: June 11, 1961, 53 y.o.   MRN: [id number]  HPI Has episodes during the day with feet swelling that resolves in the AM and has cut back on cheese and salt and this has improved Gina Padilla is on very low dose diuretic  But Gina Padilla is on 10 mg of amlodipine    Review of Systems  Constitutional: Negative for activity change, appetite change and fatigue.  HENT: Negative for congestion, ear pain, postnasal drip and sinus pressure.   Eyes: Negative for redness and visual disturbance.  Respiratory: Negative for cough, shortness of breath and wheezing.   Cardiovascular: Positive for leg swelling.  Gastrointestinal: Negative for abdominal pain and abdominal distention.  Genitourinary: Negative for dysuria, frequency and menstrual problem.  Musculoskeletal: Negative for arthralgias, joint swelling, myalgias and neck pain.  Skin: Negative for rash and wound.  Neurological: Negative for dizziness, weakness and headaches.  Hematological: Negative for adenopathy. Does not bruise/bleed easily.  Psychiatric/Behavioral: Negative for sleep disturbance and decreased concentration.       Past Medical History  Diagnosis Date  . Anemia   . GERD (gastroesophageal reflux disease)   . Hypertension   . Arthritis     History   Social History  . Marital Status: Widowed    Spouse Name: N/A    Number of Children: N/A  . Years of Education: N/A   Occupational History  . Not on file.   Social History Main Topics  . Smoking status: Never Smoker   . Smokeless tobacco: Not on file  . Alcohol Use: 1.2 oz/week    2 Glasses of wine per week  . Drug Use: Not on file  . Sexual Activity: Yes   Other Topics Concern  . Not on file   Social History Narrative  . No narrative on file    Past Surgical History  Procedure Laterality Date  . Cesarean section    . Tubal ligation    . Foot surgery    . Abdominal hysterectomy      Family History  Problem  Relation Age of Onset  . Peripheral vascular disease Father   . Anuerysm Father   . Diabetes Mother   . Hypertension Mother   . Breast cancer Mother     Allergies  Allergen Reactions  . Codeine Phosphate   . Morphine Sulfate     Current Outpatient Prescriptions on File Prior to Visit  Medication Sig Dispense Refill  . amLODipine-benazepril (LOTREL) 10-20 MG per capsule TAKE 1 CAPSULE DAILY  90 capsule  3  . Azelastine-Fluticasone (DYMISTA) 137-50 MCG/ACT SUSP Place 1 spray into the nose 2 (two) times daily.  23 g  11  . bisoprolol-hydrochlorothiazide (ZIAC) 5-6.25 MG per tablet TAKE 1 TABLET DAILY  90 tablet  2  . calcium gluconate 500 MG tablet Take 500 mg by mouth daily.        . cyclobenzaprine (FLEXERIL) 5 MG tablet Take 1 tablet (5 mg total) by mouth 3 (three) times daily as needed for muscle spasms.  30 tablet  0  . ESTRACE VAGINAL 0.1 MG/GM vaginal cream Place 2 g vaginally as needed. Per Dr Garwin Brothers, OB/GYN      . Flaxseed, Linseed, (BL FLAX SEED OIL) 1000 MG CAPS Take by mouth daily.        . metroNIDAZOLE (FLAGYL) 500 MG tablet Take 500 mg by mouth as needed.       . ranitidine (ZANTAC) 300 MG  tablet take 1 tablet by mouth at bedtime  30 tablet  11   No current facility-administered medications on file prior to visit.    BP 124/88  Pulse 71  Wt 196 lb (88.905 kg)    Objective:   Physical Exam  Constitutional: Gina Padilla is oriented to person, place, and time. Gina Padilla appears well-developed and well-nourished. No distress.  HENT:  Head: Normocephalic and atraumatic.  Right Ear: External ear normal.  Left Ear: External ear normal.  Nose: Nose normal.  Mouth/Throat: Oropharynx is clear and moist.  Eyes: Conjunctivae and EOM are normal. Pupils are equal, round, and reactive to light.  Neck: Normal range of motion. Neck supple. No JVD present. No tracheal deviation present. No thyromegaly present.  Cardiovascular: Normal rate, regular rhythm, normal heart sounds and intact  distal pulses.   No murmur heard. Pulmonary/Chest: Effort normal and breath sounds normal. Gina Padilla has no wheezes. Gina Padilla exhibits no tenderness.  Abdominal: Soft. Bowel sounds are normal.  Musculoskeletal: Normal range of motion. Gina Padilla exhibits no edema and no tenderness.  Lymphadenopathy:    Gina Padilla has no cervical adenopathy.  Neurological: Gina Padilla is alert and oriented to person, place, and time. Gina Padilla has normal reflexes. No cranial nerve deficit.  Skin: Skin is warm and dry. Gina Padilla is not diaphoretic.  Psychiatric: Gina Padilla has a normal mood and affect. Gina Padilla behavior is normal.          Assessment & Plan:  Change to Lotrel 5/40 and monitor blood pressure Continue to watch salt and cheese Stable blood pressure Has change to Dr Tawana Scale for [place]

## 2014-02-10 NOTE — Telephone Encounter (Signed)
Relevant patient education mailed to patient.  

## 2014-02-10 NOTE — Patient Instructions (Signed)
The patient is instructed to continue all medications as prescribed. Schedule followup with check out clerk upon leaving the clinic  

## 2014-04-09 ENCOUNTER — Encounter: Payer: Self-pay | Admitting: Internal Medicine

## 2014-04-09 ENCOUNTER — Other Ambulatory Visit: Payer: Self-pay | Admitting: Internal Medicine

## 2014-04-09 ENCOUNTER — Ambulatory Visit (INDEPENDENT_AMBULATORY_CARE_PROVIDER_SITE_OTHER): Payer: 59 | Admitting: Internal Medicine

## 2014-04-09 VITALS — BP 132/90 | HR 69 | Temp 98.3°F | Resp 18 | Ht 66.0 in | Wt 199.1 lb

## 2014-04-09 DIAGNOSIS — K219 Gastro-esophageal reflux disease without esophagitis: Secondary | ICD-10-CM

## 2014-04-09 DIAGNOSIS — Z23 Encounter for immunization: Secondary | ICD-10-CM

## 2014-04-09 DIAGNOSIS — I10 Essential (primary) hypertension: Secondary | ICD-10-CM

## 2014-04-09 MED ORDER — BISOPROLOL-HYDROCHLOROTHIAZIDE 5-6.25 MG PO TABS: 1.0000 | ORAL_TABLET | Freq: Every day | ORAL | Status: DC

## 2014-04-09 MED ORDER — ACIDOPHILUS PROBIOTIC BLEND PO TABS: ORAL_TABLET | ORAL | Status: DC

## 2014-04-09 NOTE — Assessment & Plan Note (Signed)
BP initially elevated which decreased with 10 minutes of sitting. No adjustment to regimen at this time although if need for change would simplify. She is currently on 2 different combo pills and some of those medicines are underdosed. Will check labs at next visit.

## 2014-04-09 NOTE — Progress Notes (Signed)
Pre visit review using our clinic review tool, if applicable. No additional management support is needed unless otherwise documented below in the visit note. 

## 2014-04-09 NOTE — Telephone Encounter (Signed)
Pt has an appt today with you at 9 am

## 2014-04-09 NOTE — Assessment & Plan Note (Signed)
Patient uses ranitidine daily for GERD and this does well for her to prevent symptoms.

## 2014-04-09 NOTE — Progress Notes (Signed)
   Subjective:    Patient ID: Gina Padilla, female    DOB: 1961/05/21, 53 y.o.   MRN: [id number]  HPI The patient is a 53 YO female with PMH of GERD, HTN, arthritis. She is coming in today to establish care. She has noticed that she is having slightly more gas at this time and wants to talk about a probiotic. She knows that her blood pressure medicine was decreased at her last visit with Dr. Arnoldo Morale and she isn't sure if it needs changed. She denies headache, chest pains, nausea, vomiting. She does not smoke and rarely drinks. She wants to talk about the new "female viagra" and is curious. She has no other complaints and overall is doing well.   Review of Systems  Constitutional: Negative for fever, chills, activity change, appetite change and fatigue.  Respiratory: Negative for cough, chest tightness, shortness of breath and wheezing.   Cardiovascular: Negative for chest pain, palpitations and leg swelling.  Gastrointestinal: Negative for nausea, vomiting, abdominal pain, diarrhea, constipation and abdominal distention.  Genitourinary: Negative for dysuria, difficulty urinating, vaginal pain and dyspareunia.  Musculoskeletal: Positive for arthralgias. Negative for gait problem and myalgias.  Neurological: Negative for dizziness, weakness, light-headedness and headaches.       Objective:   Physical Exam  Constitutional: She is oriented to person, place, and time. She appears well-developed and well-nourished. No distress.  HENT:  Head: Normocephalic and atraumatic.  Eyes: EOM are normal.  Neck: Normal range of motion. Neck supple.  Cardiovascular: Normal rate and regular rhythm.   No murmur heard. Pulmonary/Chest: Effort normal and breath sounds normal. No respiratory distress. She has no wheezes. She has no rales. She exhibits no tenderness.  Abdominal: Soft. Bowel sounds are normal. She exhibits no distension. There is no tenderness. There is no rebound.  Musculoskeletal: She  exhibits no tenderness.  Neurological: She is alert and oriented to person, place, and time.  Skin: Skin is warm and dry.   Filed Vitals:   04/09/14 0900 04/09/14 0922  BP: 152/102 132/90  Pulse: 69   Temp: 98.3 F (36.8 C)   TempSrc: Oral   Resp: 18   Height: 5\' 6"  (1.676 m)   Weight: 199 lb 1.9 oz (90.32 kg)   SpO2: 97%        Assessment & Plan:

## 2014-04-09 NOTE — Patient Instructions (Signed)
You are doing great with your low salt diet. We are not going to change your medicines today.   Work on increasing your exercise at home as this can increase your energy.   Come back in about 6 months to check on your blood pressure. If you are feeling sick before then please call our office.   Exercise to Stay Healthy Exercise helps you become and stay healthy. EXERCISE IDEAS AND TIPS Choose exercises that:  You enjoy.  Fit into your day. You do not need to exercise really hard to be healthy. You can do exercises at a slow or medium level and stay healthy. You can:  Stretch before and after working out.  Try yoga, Pilates, or tai chi.  Lift weights.  Walk fast, swim, jog, run, climb stairs, bicycle, dance, or rollerskate.  Take aerobic classes. Exercises that burn about 150 calories:  Running 1  miles in 15 minutes.  Playing volleyball for 45 to 60 minutes.  Washing and waxing a car for 45 to 60 minutes.  Playing touch football for 45 minutes.  Walking 1  miles in 35 minutes.  Pushing a stroller 1  miles in 30 minutes.  Playing basketball for 30 minutes.  Raking leaves for 30 minutes.  Bicycling 5 miles in 30 minutes.  Walking 2 miles in 30 minutes.  Dancing for 30 minutes.  Shoveling snow for 15 minutes.  Swimming laps for 20 minutes.  Walking up stairs for 15 minutes.  Bicycling 4 miles in 15 minutes.  Gardening for 30 to 45 minutes.  Jumping rope for 15 minutes.  Washing windows or floors for 45 to 60 minutes. Document Released: 08/20/2010 Document Revised: 10/10/2011 Document Reviewed: 08/20/2010 [facility] Patient Information 2015 [place], [place]. This information is not intended to replace advice given to you by your health care provider. Make sure you discuss any questions you have with your health care provider.

## 2014-04-30 ENCOUNTER — Ambulatory Visit
Admission: RE | Admit: 2014-04-30 | Discharge: 2014-04-30 | Disposition: A | Discharge status: Home / Self Care | Payer: 59 | Source: Ambulatory Visit

## 2014-04-30 ENCOUNTER — Other Ambulatory Visit: Payer: Self-pay

## 2014-04-30 DIAGNOSIS — Z1231 Encounter for screening mammogram for malignant neoplasm of breast: Secondary | ICD-10-CM

## 2014-07-15 ENCOUNTER — Ambulatory Visit: Payer: 59 | Admitting: Internal Medicine

## 2014-07-18 ENCOUNTER — Ambulatory Visit (INDEPENDENT_AMBULATORY_CARE_PROVIDER_SITE_OTHER): Payer: 59 | Admitting: Internal Medicine

## 2014-07-18 ENCOUNTER — Encounter: Payer: Self-pay | Admitting: Internal Medicine

## 2014-07-18 VITALS — BP 130/76 | HR 87 | Temp 97.9°F | Wt 200.4 lb

## 2014-07-18 DIAGNOSIS — G5601 Carpal tunnel syndrome, right upper limb: Secondary | ICD-10-CM

## 2014-07-18 DIAGNOSIS — M5010 Cervical disc disorder with radiculopathy, unspecified cervical region: Secondary | ICD-10-CM

## 2014-07-18 DIAGNOSIS — M5030 Other cervical disc degeneration, unspecified cervical region: Secondary | ICD-10-CM

## 2014-07-18 MED ORDER — GABAPENTIN 100 MG PO CAPS: ORAL_CAPSULE | ORAL | Status: DC

## 2014-07-18 NOTE — Progress Notes (Signed)
   Subjective:    Patient ID: Gina Padilla, female    DOB: 1961/06/06, 53 y.o.   MRN: [id number]  HPI She has had pain across the upper back above the shoulder level intermittently for several years. She's had documented bulging of cervical discs at several levels on MRI. She also has spondylosis at one level. 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 She's been using BC powders with partial relief. She is intolerant to codeine and morphine.   MRI 08/21/13 showed stability of spondylosis with multilevel disc protrusion superimposed on a congenitally small spinal canal compared to a study 05/29/10   She had chronic mild cord flattening on the left at C3-4 and on the right at C4-5. She also had mild-to-moderate foraminal narrowing. Report reviewed with her & copy provided.   Additionally for 3-4 months she's had pain and tingling in the right upper extremity from the elbow to the hand. This is worse after using a computer.    Review of Systems  She denies extension of the pain at the shoulder level into the upper extremities.  Fever, chills, sweats, or unexplained weight loss not present. No significant headaches. Mental status change or memory loss denied. Blurred vision , diplopia or vision loss absent. Vertigo, near syncope or imbalance denied. No loss of control of bladder or bowels.      Objective:   Physical Exam  Appears younger than stated age Strength, tone, deep tendon reflexes are normal in the upper extremities.   Tinel's sign is also negative. There is no neuromuscular deficit to direct confrontation.  General appearance :adequately nourished; in no distress. Eyes: No conjunctival inflammation or scleral  icterus is present. Heart:  Normal rate and regular rhythm. S1 and S2 normal without gallop, murmur, click, rub or other extra sounds  Lungs:Chest clear to auscultation; no wheezes, rhonchi,rales ,or rubs present.No increased work of breathing.  No flank tenderness to percussion. Vascular : Carotid pulses equal ; no bruits present. Skin:Warm & dry.  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 Findings stable 2011-2015  #2 carpal tunnel syndrome right upper extremity is likely related to repetitive hand motion at the computer  Plan: The pathophysiology was discussed with her.  Gabapentin would be recommended for the cervical area pain. A wrist splint would be recommended for the carpal tunnel. She can also use a wrist board at the computer.  Physical therapy recommended for cervical issues.  Emphasis should be on ergonomics @ the work place.

## 2014-07-18 NOTE — Progress Notes (Signed)
Pre visit review using our clinic review tool, if applicable. No additional management support is needed unless otherwise documented below in the visit note. 

## 2014-07-18 NOTE — Patient Instructions (Addendum)
Go to Web M[employer] for information on Carpal Tunnel Syndrome. If symptoms persist or progress with a wrist board or wrist splint; nerve conduction/EMG studies would be indicated. If these were abnormal, Hand Surgery referral would be indicated. Assess ergonomic risks for repetitive neck/spine movement at work. Usually this is related to the height @ which  computer  is placed and lack of back support while seated  at the desk. Use a cervical memory foam pillow to prevent hyperextension or hyperflexion of the cervical spine @ night

## 2014-10-02 ENCOUNTER — Other Ambulatory Visit: Payer: Self-pay | Admitting: Obstetrics and Gynecology

## 2014-10-02 DIAGNOSIS — N6320 Unspecified lump in the left breast, unspecified quadrant: Secondary | ICD-10-CM

## 2014-10-08 ENCOUNTER — Ambulatory Visit: Payer: 59 | Admitting: Internal Medicine

## 2014-10-09 ENCOUNTER — Ambulatory Visit
Admission: RE | Admit: 2014-10-09 | Discharge: 2014-10-09 | Disposition: A | Discharge status: Home / Self Care | Payer: 59 | Source: Ambulatory Visit | Attending: Obstetrics and Gynecology | Admitting: Obstetrics and Gynecology

## 2014-10-09 DIAGNOSIS — N6320 Unspecified lump in the left breast, unspecified quadrant: Secondary | ICD-10-CM

## 2014-12-25 ENCOUNTER — Telehealth: Payer: Self-pay

## 2014-12-25 DIAGNOSIS — M5010 Cervical disc disorder with radiculopathy, unspecified cervical region: Secondary | ICD-10-CM

## 2014-12-25 MED ORDER — GABAPENTIN 100 MG PO CAPS: ORAL_CAPSULE | ORAL | Status: DC

## 2014-12-25 MED ORDER — GABAPENTIN 100 MG PO CAPS
ORAL_CAPSULE | ORAL | Status: DC
Start: 2014-12-25 — End: 2015-09-29

## 2014-12-25 NOTE — Telephone Encounter (Signed)
Pt requesting refill of gabapentin 100 mg. Last OV 07/18/14. Please advise.

## 2014-12-25 NOTE — Telephone Encounter (Signed)
OK X1 

## 2014-12-25 NOTE — Addendum Note (Signed)
Addended by: Delice Bison E on: 12/25/2014 01:40 PM   Modules accepted: Orders, Medications

## 2014-12-25 NOTE — Telephone Encounter (Signed)
Medication filled.  

## 2015-02-16 ENCOUNTER — Other Ambulatory Visit: Payer: Self-pay | Admitting: Specialist

## 2015-02-16 DIAGNOSIS — M542 Cervicalgia: Secondary | ICD-10-CM

## 2015-02-22 ENCOUNTER — Ambulatory Visit
Admission: RE | Admit: 2015-02-22 | Discharge: 2015-02-22 | Disposition: A | Discharge status: Home / Self Care | Payer: 59 | Source: Ambulatory Visit | Attending: Specialist | Admitting: Specialist

## 2015-02-22 DIAGNOSIS — M542 Cervicalgia: Secondary | ICD-10-CM

## 2015-04-30 ENCOUNTER — Other Ambulatory Visit: Payer: Self-pay

## 2015-04-30 DIAGNOSIS — Z1231 Encounter for screening mammogram for malignant neoplasm of breast: Secondary | ICD-10-CM

## 2015-05-07 ENCOUNTER — Ambulatory Visit
Admission: RE | Admit: 2015-05-07 | Discharge: 2015-05-07 | Disposition: A | Discharge status: Home / Self Care | Payer: 59 | Source: Ambulatory Visit

## 2015-05-07 DIAGNOSIS — Z1231 Encounter for screening mammogram for malignant neoplasm of breast: Secondary | ICD-10-CM

## 2015-05-08 ENCOUNTER — Other Ambulatory Visit: Payer: Self-pay | Admitting: Internal Medicine

## 2015-08-11 ENCOUNTER — Other Ambulatory Visit: Payer: Self-pay | Admitting: Specialist

## 2015-08-11 DIAGNOSIS — M25561 Pain in right knee: Secondary | ICD-10-CM

## 2015-09-07 ENCOUNTER — Ambulatory Visit
Admission: RE | Admit: 2015-09-07 | Discharge: 2015-09-07 | Disposition: A | Discharge status: Home / Self Care | Payer: 59 | Source: Ambulatory Visit | Attending: Specialist | Admitting: Specialist

## 2015-09-07 DIAGNOSIS — M25561 Pain in right knee: Secondary | ICD-10-CM

## 2015-09-29 ENCOUNTER — Encounter: Payer: Self-pay | Admitting: Internal Medicine

## 2015-09-29 ENCOUNTER — Ambulatory Visit (INDEPENDENT_AMBULATORY_CARE_PROVIDER_SITE_OTHER): Payer: 59 | Admitting: Internal Medicine

## 2015-09-29 VITALS — BP 150/90 | HR 60 | Temp 98.1°F | Resp 12 | Ht 66.5 in | Wt 200.0 lb

## 2015-09-29 DIAGNOSIS — I10 Essential (primary) hypertension: Secondary | ICD-10-CM

## 2015-09-29 DIAGNOSIS — R7303 Prediabetes: Secondary | ICD-10-CM | POA: Diagnosis not present

## 2015-09-29 NOTE — Assessment & Plan Note (Signed)
BP mildly elevated and did not take her ziac yet today due to this visit. Refills provided and will get labs from her gyn. At some point in the near future (next visit) would be good to see if we could combine her meds since she is on low dose of 2 meds right now that may be combined.

## 2015-09-29 NOTE — Progress Notes (Signed)
   Subjective:    Patient ID: Gina Padilla, female    DOB: [birth year], 55 y.o.   MRN: [id number]  HPI The patient is a 55 YO female coming in for sugar problems. Her gynecologist took some blood work and noticed that her HgA1c was 6.4. She is a big sugar eater. She eats cake and dessert most days. Also drinks sweet tea but not coke or sodas. Not exercising much but plans to start since this information has come to light. She is meeting with nutritionist in the next month to help with information about diet. No numbness or tingling in her feet.   Review of Systems  Constitutional: Negative for fever, chills, activity change, appetite change and fatigue.  Respiratory: Negative for cough, chest tightness, shortness of breath and wheezing.   Cardiovascular: Negative for chest pain, palpitations and leg swelling.  Gastrointestinal: Negative for nausea, vomiting, abdominal pain, diarrhea, constipation and abdominal distention.  Genitourinary: Negative for dysuria, difficulty urinating, vaginal pain and dyspareunia.  Musculoskeletal: Negative for myalgias, arthralgias and gait problem.  Neurological: Negative for dizziness, weakness, light-headedness and headaches.      Objective:   Physical Exam  Constitutional: She appears well-developed and well-nourished. No distress.  HENT:  Head: Normocephalic and atraumatic.  Eyes: EOM are normal.  Cardiovascular: Normal rate and regular rhythm.   No murmur heard. Pulmonary/Chest: Effort normal and breath sounds normal. No respiratory distress. She has no wheezes. She has no rales. She exhibits no tenderness.  Abdominal: Soft. She exhibits no distension. There is no tenderness. There is no rebound.  Musculoskeletal: She exhibits no tenderness.  Neurological: She is alert.  Skin: Skin is warm and dry.   Filed Vitals:   09/29/15 1055  BP: 150/90  Pulse: 60  Temp: 98.1 F (36.7 C)  TempSrc: Oral  Resp: 12  Height: 5' 6.5" (1.689 m)  Weight: 200  lb (90.719 kg)  SpO2: 93%      Assessment & Plan:

## 2015-09-29 NOTE — Assessment & Plan Note (Signed)
Last Hga1c 6.4 with gyn. Talked to her about the etiology of higher sugars. She needs to cut back on sweets and sweet beverages. She is meeting with nutrition later this month. Also talked to her about diet and exercise. She is on ACE-I already for BP. See her back in 6 months and check HgA1c. Her goal is to stay off medication.

## 2015-09-29 NOTE — Progress Notes (Signed)
Pre visit review using our clinic review tool, if applicable. No additional management support is needed unless otherwise documented below in the visit note. 

## 2015-09-29 NOTE — Patient Instructions (Signed)
Diabetes and Exercise Exercising regularly is important. It is not just about losing weight. It has many health benefits, such as:  Improving your overall fitness, flexibility, and endurance.  Increasing your bone density.  Helping with weight control.  Decreasing your body fat.  Increasing your muscle strength.  Reducing stress and tension.  Improving your overall health. People with diabetes who exercise gain additional benefits because exercise:  Reduces appetite.  Improves the body's use of blood sugar (glucose).  Helps lower or control blood glucose.  Decreases blood pressure.  Helps control blood lipids (such as cholesterol and triglycerides).  Improves the body's use of the hormone insulin by:  Increasing the body's insulin sensitivity.  Reducing the body's insulin needs.  Decreases the risk for heart disease because exercising:  Lowers cholesterol and triglycerides levels.  Increases the levels of good cholesterol (such as high-density lipoproteins [HDL]) in the body.  Lowers blood glucose levels. YOUR ACTIVITY PLAN  Choose an activity that you enjoy, and set realistic goals. To exercise safely, you should begin practicing any new physical activity slowly, and gradually increase the intensity of the exercise over time. Your health care provider or diabetes educator can help create an activity plan that works for you. General recommendations include:  Encouraging children to engage in at least 60 minutes of physical activity each day.  Stretching and performing strength training exercises, such as yoga or weight lifting, at least 2 times per week.  Performing a total of at least 150 minutes of moderate-intensity exercise each week, such as brisk walking or water aerobics.  Exercising at least 3 days per week, making sure you allow no more than 2 consecutive days to pass without exercising.  Avoiding long periods of inactivity (90 minutes or more). When you  have to spend an extended period of time sitting down, take frequent breaks to walk or stretch. RECOMMENDATIONS FOR EXERCISING WITH TYPE 1 OR TYPE 2 DIABETES   Check your blood glucose before exercising. If blood glucose levels are greater than 240 mg/dL, check for urine ketones. Do not exercise if ketones are present.  Avoid injecting insulin into areas of the body that are going to be exercised. For example, avoid injecting insulin into:  The arms when playing tennis.  The legs when jogging.  Keep a record of:  Food intake before and after you exercise.  Expected peak times of insulin action.  Blood glucose levels before and after you exercise.  The type and amount of exercise you have done.  Review your records with your health care provider. Your health care provider will help you to develop guidelines for adjusting food intake and insulin amounts before and after exercising.  If you take insulin or oral hypoglycemic agents, watch for signs and symptoms of hypoglycemia. They include:  Dizziness.  Shaking.  Sweating.  Chills.  Confusion.  Drink plenty of water while you exercise to prevent dehydration or heat stroke. Body water is lost during exercise and must be replaced.  Talk to your health care provider before starting an exercise program to make sure it is safe for you. Remember, almost any type of activity is better than none.   This information is not intended to replace advice given to you by your health care provider. Make sure you discuss any questions you have with your health care provider.   Document Released: 10/08/2003 Document Revised: 12/02/2014 Document Reviewed: 12/25/2012 Elsevier Interactive Patient Education 2016 [employer]  

## 2015-10-22 ENCOUNTER — Ambulatory Visit: Payer: 59 | Admitting: Dietician

## 2015-11-24 ENCOUNTER — Encounter: Payer: Self-pay | Admitting: Dietician

## 2015-11-24 ENCOUNTER — Encounter: Payer: 59 | Attending: Obstetrics and Gynecology | Admitting: Dietician

## 2015-11-24 VITALS — Ht 66.0 in | Wt 201.0 lb

## 2015-11-24 DIAGNOSIS — R7303 Prediabetes: Secondary | ICD-10-CM | POA: Insufficient documentation

## 2015-11-24 DIAGNOSIS — E663 Overweight: Secondary | ICD-10-CM

## 2015-11-24 NOTE — Patient Instructions (Signed)
Rethink what you drink!   Be as active as possible.  Aim for some form of activity most days for 30 minutes. Add more non starchy vegetables. Eat mindfully.  Choose healthy, eat slowly, stop when full. Eat away from the TV and other electronics. Aim for 2-3 Carb Choices per meal (30-45 grams) +/- 1 either way  Aim for 0-1 Carbs per snack if hungry  Include protein in moderation with your meals and snacks Consider reading food labels for Total Carbohydrate and Fat Grams of foods [email or web address] [email or web address]

## 2015-11-24 NOTE — Progress Notes (Signed)
Medical Nutrition Therapy:  Appt start time: 0945 end time:  1045.   Assessment:  Primary concerns today: Patient is here today alone due to elevated HgbA1C of 6.4% January 2017.  She would like to learn how to prepare meals and eat more healthfully to lose weight.  She states that she likes increased amounts of sugar.   Hx includes HTN, GERD, and vitamin D deficiency.  Weight hx: Today 201 lbs Highest adult weight:  201 lbs Lowest:  170 lbs after husband passed. And 142 lbs at 89.     Patient's son and daughter live with patient.  She does all of the cooking and shopping.  She works for [employer].  She is a widow for the past 11 years..  She finds cooking for herself difficult.  She often eats out.  Preferred Learning Style:   No preference indicated   Learning Readiness:   Ready  MEDICATIONS: see list.  She is also getting Vitamin B-12 injections and hormonal therapy from [facility].   DIETARY INTAKE: Usual eating pattern includes 3 meals and 1-3 snacks per day. Avoided foods include added salt.    24-hr recall:  B ( AM): fried egg and 2 strips bacon, coffee with equal from work cafeteria OR oatmeal with cinnamon and raisins OR greek yogurt OR seldom McDonald's McGriddle (sausage, egg, cheese, hashbrowns) OR Saturday:  Cereal and 2% milk OR Sunday:  Liverpudding, pancakes with syrup Snk ( AM): none or occasional Tootsie Rolls or other candy L ( PM): Out to eat:  [employer] burger and fries and sweet tea OR [employer] 2 piece with dirty rice and sweet tea or [employer]aco [employer] or Subway Snk ( PM): occasional chips or oatmeal cookie or honeybun more rarely, used to eat gummy candy D ( PM): will skip dinner is she had a snack OR fast food OR cooks baked fish/shrimp with fried potatoes  Snk ( PM): occasional chips or ice cream (but problems with lactose intolerant) Beverages: water, sweet tea, coffee with equal, regular gingerale, OJ rare  Usual physical activity: none  (walks with boyfriend  occasionally)  Estimated energy needs: 1500 calories 1700 g carbohydrates 112 g protein 42 g fat  Progress Towards Goal(s):  In progress.   Nutritional Diagnosis:  NB-1.1 Food and nutrition-related knowledge deficit As related to balance of carbohydrate, protein, and fat.  As evidenced by diet hx.    Intervention:  Nutrition counseling/education counseling and diabetes education initiated. Discussed Carb Counting by food group as method of portion control, reading food labels, and benefits of increased activity. Also discussed basic physiology of Diabetes, target BG ranges pre and post meals, and A1c. Discussed mindful eating.  Rethink what you drink!   Be as active as possible.  Aim for some form of activity most days for 30 minutes. Add more non starchy vegetables. Eat mindfully.  Choose healthy, eat slowly, stop when full. Eat away from the TV and other electronics. Aim for 2-3 Carb Choices per meal (30-45 grams) +/- 1 either way  Aim for 0-1 Carbs per snack if hungry  Include protein in moderation with your meals and snacks Consider reading food labels for Total Carbohydrate and Fat Grams of foods Calorie [email or web address]  Teaching Method Utilized:  Visual Auditory Hands on  Handouts given during visit include: Living Well with Diabetes Food Label handouts Meal Plan Card Breakfast ideas Snack sheet A1C handout Label reading  Barriers to learning/adherence to lifestyle change: none  Demonstrated degree of understanding via:  Teach  Back   Monitoring/Evaluation:  Dietary intake, exercise, label reading, and body weight prn.

## 2015-12-08 ENCOUNTER — Other Ambulatory Visit: Payer: Self-pay | Admitting: *Deleted

## 2015-12-08 MED ORDER — BISOPROLOL-HYDROCHLOROTHIAZIDE 5-6.25 MG PO TABS: 1.0000 | ORAL_TABLET | Freq: Every day | ORAL | Status: DC

## 2015-12-08 MED ORDER — AMLODIPINE BESY-BENAZEPRIL HCL 5-40 MG PO CAPS
1.0000 | ORAL_CAPSULE | Freq: Every day | ORAL | Status: DC
Start: 2015-12-08 — End: 2016-04-11

## 2015-12-08 NOTE — Addendum Note (Signed)
Addended by: Earnstine Regal on: 12/08/2015 10:48 AM   Modules accepted: Orders

## 2016-03-28 ENCOUNTER — Ambulatory Visit: Payer: 59 | Admitting: Internal Medicine

## 2016-04-07 ENCOUNTER — Ambulatory Visit: Payer: 59 | Admitting: Internal Medicine

## 2016-04-11 ENCOUNTER — Ambulatory Visit (INDEPENDENT_AMBULATORY_CARE_PROVIDER_SITE_OTHER): Payer: 59 | Admitting: Internal Medicine

## 2016-04-11 ENCOUNTER — Other Ambulatory Visit (INDEPENDENT_AMBULATORY_CARE_PROVIDER_SITE_OTHER): Payer: 59

## 2016-04-11 ENCOUNTER — Encounter: Payer: Self-pay | Admitting: Internal Medicine

## 2016-04-11 VITALS — BP 146/90 | HR 64 | Temp 98.4°F | Resp 14 | Ht 66.0 in | Wt 205.8 lb

## 2016-04-11 DIAGNOSIS — R7303 Prediabetes: Secondary | ICD-10-CM

## 2016-04-11 DIAGNOSIS — R7301 Impaired fasting glucose: Secondary | ICD-10-CM

## 2016-04-11 DIAGNOSIS — Z23 Encounter for immunization: Secondary | ICD-10-CM

## 2016-04-11 DIAGNOSIS — I10 Essential (primary) hypertension: Secondary | ICD-10-CM | POA: Diagnosis not present

## 2016-04-11 LAB — VITAMIN D 25 HYDROXY (VIT D DEFICIENCY, FRACTURES): VITD: 22.95 ng/mL — ABNORMAL LOW (ref 30.00–100.00)

## 2016-04-11 LAB — LIPID PANEL
Cholesterol: 146 mg/dL (ref 0–200)
HDL: 49.3 mg/dL (ref >39.00)
LDL Cholesterol: 74 mg/dL (ref 0–99)
NonHDL: 97.15
Total CHOL/HDL Ratio: 3
Triglycerides: 115 mg/dL (ref 0.0–149.0)
VLDL: 23 mg/dL (ref 0.0–40.0)

## 2016-04-11 LAB — HEMOGLOBIN A1C: Hgb A1c MFr Bld: 6 % (ref 4.6–6.5)

## 2016-04-11 MED ORDER — AMLODIPINE BESY-BENAZEPRIL HCL 5-40 MG PO CAPS: 1.0000 | ORAL_CAPSULE | Freq: Every day | ORAL | 3 refills | Status: DC

## 2016-04-11 MED ORDER — BISOPROLOL-HYDROCHLOROTHIAZIDE 5-6.25 MG PO TABS: 1.0000 | ORAL_TABLET | Freq: Every day | ORAL | 3 refills | Status: DC

## 2016-04-11 MED ORDER — RANITIDINE HCL 300 MG PO TABS: 300.0000 mg | ORAL_TABLET | Freq: Two times a day (BID) | ORAL | 3 refills | Status: DC

## 2016-04-11 NOTE — Progress Notes (Signed)
   Subjective:    Patient ID: Gina Padilla, female    DOB: 1961/04/20, 55 y.o.   MRN: [id number]  HPI The patient is a 55 YO female coming in for follow up of her pre-diabetes. Her last HgA1c was 6.4 at gynecologist's office. She is out of her blood pressure medicine for the last week because she was not able to get it from her mail order pharmacy. She has met with nutritionist which helped some. Has not started exercising yet. Weight is up a couple of pounds since last visit.   Review of Systems  Constitutional: Negative for activity change, appetite change, chills, fatigue and fever.  Respiratory: Negative for cough, chest tightness, shortness of breath and wheezing.   Cardiovascular: Negative for chest pain, palpitations and leg swelling.  Gastrointestinal: Negative for abdominal distention, abdominal pain, constipation, diarrhea, nausea and vomiting.  Genitourinary: Negative for difficulty urinating, dyspareunia, dysuria and vaginal pain.  Musculoskeletal: Negative for arthralgias, gait problem and myalgias.  Neurological: Negative for dizziness, weakness, light-headedness and headaches.      Objective:   Physical Exam  Constitutional: She appears well-developed and well-nourished.  HENT:  Head: Normocephalic and atraumatic.  Eyes: EOM are normal.  Cardiovascular: Normal rate and regular rhythm.   Pulmonary/Chest: Effort normal and breath sounds normal. No respiratory distress. She has no wheezes. She has no rales. She exhibits no tenderness.  Abdominal: Soft. She exhibits no distension. There is no tenderness. There is no rebound.  Musculoskeletal: She exhibits no tenderness.  Neurological: She is alert.  Skin: Skin is warm and dry.   Vitals:   04/11/16 0947  BP: (!) 152/98  Pulse: 64  Resp: 14  Temp: 98.4 F (36.9 C)  TempSrc: Oral  SpO2: 98%  Weight: 205 lb 12.8 oz (93.4 kg)  Height: '5\' 6"'$  (1.676 m)      Assessment & Plan:  Flu shot given at visit.

## 2016-04-11 NOTE — Assessment & Plan Note (Signed)
BP up since she is out of her meds. Refills done today and encouraged to call if she is running out of meds in the future.

## 2016-04-11 NOTE — Patient Instructions (Signed)
We are checking the labs today and will call back about the results.   Work on parking far away from stores and taking stairs whenever possible to add some exercise to your normal day.   Calorie Counting for Weight Loss Calories are energy you get from the things you eat and drink. Your body uses this energy to keep you going throughout the day. The number of calories you eat affects your weight. When you eat more calories than your body needs, your body stores the extra calories as fat. When you eat fewer calories than your body needs, your body burns fat to get the energy it needs. Calorie counting means keeping track of how many calories you eat and drink each day. If you make sure to eat fewer calories than your body needs, you should lose weight. In order for calorie counting to work, you will need to eat the number of calories that are right for you in a day to lose a healthy amount of weight per week. A healthy amount of weight to lose per week is usually 1-2 lb (0.5-0.9 kg). A dietitian can determine how many calories you need in a day and give you suggestions on how to reach your calorie goal.  WHAT IS MY MY PLAN? My goal is to have __1500________ calories per day.  If I have this many calories per day, I should lose around __________ pounds per week. WHAT DO I NEED TO KNOW ABOUT CALORIE COUNTING? In order to meet your daily calorie goal, you will need to:  Find out how many calories are in each food you would like to eat. Try to do this before you eat.  Decide how much of the food you can eat.  Write down what you ate and how many calories it had. Doing this is called keeping a food log. WHERE DO I FIND CALORIE INFORMATION? The number of calories in a food can be found on a Nutrition Facts label. Note that all the information on a label is based on a specific serving of the food. If a food does not have a Nutrition Facts label, try to look up the calories online or ask your dietitian for  help. HOW DO I DECIDE HOW MUCH TO EAT? To decide how much of the food you can eat, you will need to consider both the number of calories in one serving and the size of one serving. This information can be found on the Nutrition Facts label. If a food does not have a Nutrition Facts label, look up the information online or ask your dietitian for help. Remember that calories are listed per serving. If you choose to have more than one serving of a food, you will have to multiply the calories per serving by the amount of servings you plan to eat. For example, the label on a package of bread might say that a serving size is 1 slice and that there are 90 calories in a serving. If you eat 1 slice, you will have eaten 90 calories. If you eat 2 slices, you will have eaten 180 calories. HOW DO I KEEP A FOOD LOG? After each meal, record the following information in your food log:  What you ate.  How much of it you ate.  How many calories it had.  Then, add up your calories. Keep your food log near you, such as in a small notebook in your pocket. Another option is to use a mobile app or website.  Some programs will calculate calories for you and show you how many calories you have left each time you add an item to the log. WHAT ARE SOME CALORIE COUNTING TIPS?  Use your calories on foods and drinks that will fill you up and not leave you hungry. Some examples of this include foods like nuts and nut butters, vegetables, lean proteins, and high-fiber foods (more than 5 g fiber per serving).  Eat nutritious foods and avoid empty calories. Empty calories are calories you get from foods or beverages that do not have many nutrients, such as candy and soda. It is better to have a nutritious high-calorie food (such as an avocado) than a food with few nutrients (such as a bag of chips).  Know how many calories are in the foods you eat most often. This way, you do not have to look up how many calories they have each  time you eat them.  Look out for foods that may seem like low-calorie foods but are really high-calorie foods, such as baked goods, soda, and fat-free candy.  Pay attention to calories in drinks. Drinks such as sodas, specialty coffee drinks, alcohol, and juices have a lot of calories yet do not fill you up. Choose low-calorie drinks like water and diet drinks.  Focus your calorie counting efforts on higher calorie items. Logging the calories in a garden salad that contains only vegetables is less important than calculating the calories in a milk shake.  Find a way of tracking calories that works for you. Get creative. Most people who are successful find ways to keep track of how much they eat in a day, even if they do not count every calorie. WHAT ARE SOME PORTION CONTROL TIPS?  Know how many calories are in a serving. This will help you know how many servings of a certain food you can have.  Use a measuring cup to measure serving sizes. This is helpful when you start out. With time, you will be able to estimate serving sizes for some foods.  Take some time to put servings of different foods on your favorite plates, bowls, and cups so you know what a serving looks like.  Try not to eat straight from a bag or box. Doing this can lead to overeating. Put the amount you would like to eat in a cup or on a plate to make sure you are eating the right portion.  Use smaller plates, glasses, and bowls to prevent overeating. This is a quick and easy way to practice portion control. If your plate is smaller, less food can fit on it.  Try not to multitask while eating, such as watching TV or using your computer. If it is time to eat, sit down at a table and enjoy your food. Doing this will help you to start recognizing when you are full. It will also make you more aware of what and how much you are eating. HOW CAN I CALORIE COUNT WHEN EATING OUT?  Ask for smaller portion sizes or child-sized  portions.  Consider sharing an entree and sides instead of getting your own entree.  If you get your own entree, eat only half. Ask for a box at the beginning of your meal and put the rest of your entree in it so you are not tempted to eat it.  Look for the calories on the menu. If calories are listed, choose the lower calorie options.  Choose dishes that include vegetables, fruits, whole grains, low-fat  dairy products, and lean protein. Focusing on smart food choices from each of the 5 food groups can help you stay on track at restaurants.  Choose items that are boiled, broiled, grilled, or steamed.  Choose water, milk, unsweetened iced tea, or other drinks without added sugars. If you want an alcoholic beverage, choose a lower calorie option. For example, a regular margarita can have up to 700 calories and a glass of wine has around 150.  Stay away from items that are buttered, battered, fried, or served with cream sauce. Items labeled "crispy" are usually fried, unless stated otherwise.  Ask for dressings, sauces, and syrups on the side. These are usually very high in calories, so do not eat much of them.  Watch out for salads. Many people think salads are a healthy option, but this is often not the case. Many salads come with bacon, fried chicken, lots of cheese, fried chips, and dressing. All of these items have a lot of calories. If you want a salad, choose a garden salad and ask for grilled meats or steak. Ask for the dressing on the side, or ask for olive oil and vinegar or lemon to use as dressing.  Estimate how many servings of a food you are given. For example, a serving of cooked rice is  cup or about the size of half a tennis ball or one cupcake wrapper. Knowing serving sizes will help you be aware of how much food you are eating at restaurants. The list below tells you how big or small some common portion sizes are based on everyday objects.  1 oz--4 stacked dice.  3 oz--1 deck  of cards.  1 tsp--1 dice.  1 Tbsp-- a Ping-Pong ball.  2 Tbsp--1 Ping-Pong ball.   cup--1 tennis ball or 1 cupcake wrapper.  1 cup--1 baseball.   This information is not intended to replace advice given to you by your health care provider. Make sure you discuss any questions you have with your health care provider.   Document Released: 07/18/2005 Document Revised: 08/08/2014 Document Reviewed: 05/23/2013 Elsevier Interactive Patient Education 2016 [employer].   Exercising to [employer] Exercising can help you to lose weight. In order to lose weight through exercise, you need to do vigorous-intensity exercise. You can tell that you are exercising with vigorous intensity if you are breathing very hard and fast and cannot hold a conversation while exercising. Moderate-intensity exercise helps to maintain your current weight. You can tell that you are exercising at a moderate level if you have a higher heart rate and faster breathing, but you are still able to hold a conversation. HOW OFTEN SHOULD I EXERCISE? Choose an activity that you enjoy and set realistic goals. Your health care provider can help you to make an activity plan that works for you. Exercise regularly as directed by your health care provider. This may include:  Doing resistance training twice each week, such as:  Push-ups.  Sit-ups.  Lifting weights.  Using resistance bands.  Doing a given intensity of exercise for a given amount of time. Choose from these options:  150 minutes of moderate-intensity exercise every week.  75 minutes of vigorous-intensity exercise every week.  A mix of moderate-intensity and vigorous-intensity exercise every week. Children, pregnant women, people who are out of shape, people who are overweight, and older adults may need to consult a health care provider for individual recommendations. If you have any sort of medical condition, be sure to consult your health care  provider  before starting a new exercise program. WHAT ARE SOME ACTIVITIES THAT CAN HELP ME TO LOSE WEIGHT?   Walking at a rate of at least 4.5 miles an hour.  Jogging or running at a rate of 5 miles per hour.  Biking at a rate of at least 10 miles per hour.  Lap swimming.  Roller-skating or in-line skating.  Cross-country skiing.  Vigorous competitive sports, such as football, basketball, and soccer.  Jumping rope.  Aerobic dancing. HOW CAN I BE MORE ACTIVE IN MY DAY-TO-DAY ACTIVITIES?  Use the stairs instead of the elevator.  Take a walk during your lunch break.  If you drive, park your car farther away from work or school.  If you take public transportation, get off one stop early and walk the rest of the way.  Make all of your phone calls while standing up and walking around.  Get up, stretch, and walk around every 30 minutes throughout the day. WHAT GUIDELINES SHOULD I FOLLOW WHILE EXERCISING?  Do not exercise so much that you hurt yourself, feel dizzy, or get very short of breath.  Consult your health care provider prior to starting a new exercise program.  Wear comfortable clothes and shoes with good support.  Drink plenty of water while you exercise to prevent dehydration or heat stroke. Body water is lost during exercise and must be replaced.  Work out until you breathe faster and your heart beats faster.   This information is not intended to replace advice given to you by your health care provider. Make sure you discuss any questions you have with your health care provider.   Document Released: 08/20/2010 Document Revised: 08/08/2014 Document Reviewed: 12/19/2013 Elsevier Interactive Patient Education [employer].

## 2016-04-11 NOTE — Progress Notes (Signed)
Pre visit review using our clinic review tool, if applicable. No additional management support is needed unless otherwise documented below in the visit note. 

## 2016-04-11 NOTE — Assessment & Plan Note (Signed)
Checking HgA1c today, last HgA1c 6.4. If increasing will be getting very close to diabetes. She is not exercising and working on diet but struggling. Met with nutrition and this helped some.

## 2016-06-15 ENCOUNTER — Other Ambulatory Visit: Payer: Self-pay | Admitting: Obstetrics and Gynecology

## 2016-06-15 DIAGNOSIS — Z1231 Encounter for screening mammogram for malignant neoplasm of breast: Secondary | ICD-10-CM

## 2016-07-12 ENCOUNTER — Ambulatory Visit
Admission: RE | Admit: 2016-07-12 | Discharge: 2016-07-12 | Disposition: A | Discharge status: Home / Self Care | Payer: 59 | Source: Ambulatory Visit | Attending: Obstetrics and Gynecology | Admitting: Obstetrics and Gynecology

## 2016-07-12 DIAGNOSIS — Z1231 Encounter for screening mammogram for malignant neoplasm of breast: Secondary | ICD-10-CM

## 2016-07-14 ENCOUNTER — Other Ambulatory Visit: Payer: Self-pay | Admitting: Obstetrics and Gynecology

## 2016-07-14 DIAGNOSIS — R928 Other abnormal and inconclusive findings on diagnostic imaging of breast: Secondary | ICD-10-CM

## 2016-07-18 ENCOUNTER — Ambulatory Visit
Admission: RE | Admit: 2016-07-18 | Discharge: 2016-07-18 | Disposition: A | Discharge status: Home / Self Care | Payer: 59 | Source: Ambulatory Visit | Attending: Obstetrics and Gynecology | Admitting: Obstetrics and Gynecology

## 2016-07-18 DIAGNOSIS — R928 Other abnormal and inconclusive findings on diagnostic imaging of breast: Secondary | ICD-10-CM

## 2016-09-24 ENCOUNTER — Ambulatory Visit (HOSPITAL_COMMUNITY)
Admission: EM | Admit: 2016-09-24 | Discharge: 2016-09-24 | Disposition: A | Discharge status: Home / Self Care | Payer: 59 | Attending: Family Medicine | Admitting: Family Medicine

## 2016-09-24 ENCOUNTER — Encounter (HOSPITAL_COMMUNITY): Payer: Self-pay | Admitting: Emergency Medicine

## 2016-09-24 DIAGNOSIS — R0781 Pleurodynia: Secondary | ICD-10-CM

## 2016-09-24 NOTE — ED Provider Notes (Signed)
CSN: [id number]     Arrival date & time 09/24/16  1732 History   First MD Initiated Contact with Patient 09/24/16 1800     Chief Complaint  Patient presents with  . URI   (Consider location/radiation/quality/duration/timing/severity/associated sxs/prior Treatment) Patient is a well-appearing 56 y.o. Female, presents today for R rib pain. Patient started getting sick 3 days ago on Wednesday with body aching, feeling bad, some coughing and had chills. She was unsure of fever; didn't check temp. She took some Theraflu on Wednesday and have felt better since. Patient came in today mainly for the R rib pain onset today after a big sneeze. Patient reports that she no longer has the  chills and her body aches has resolved but she continuous to have a bad cough. Patient purchased a bottle of robitussin today but haven't used it yet.       Past Medical History:  Diagnosis Date  . Anemia   . Arthritis   . GERD (gastroesophageal reflux disease)   . Hypertension    Past Surgical History:  Procedure Laterality Date  . ABDOMINAL HYSTERECTOMY    . CESAREAN SECTION    . FOOT SURGERY    . TUBAL LIGATION     Family History  Problem Relation Age of Onset  . Peripheral vascular disease Father   . Anuerysm Father   . Diabetes Mother   . Hypertension Mother   . Breast cancer Mother    Social History  Substance Use Topics  . Smoking status: Never Smoker  . Smokeless tobacco: Never Used  . Alcohol use 1.2 oz/week    2 Glasses of wine per week   OB History    No data available     Review of Systems  HENT: Positive for congestion, rhinorrhea, sinus pain and sinus pressure. Negative for sore throat.   Respiratory: Positive for cough. Negative for shortness of breath.   Cardiovascular: Negative for chest pain and palpitations.  Gastrointestinal: Negative for abdominal pain, nausea and vomiting.  Musculoskeletal: Negative for myalgias.  Neurological: Positive for headaches. Negative for  dizziness.    Allergies  Codeine phosphate and Morphine sulfate  Home Medications   Prior to Admission medications   Medication Sig Start Date End Date Taking? Authorizing Provider  amLODipine-benazepril (LOTREL) 5-40 MG capsule Take 1 capsule by mouth daily. 04/11/16   Hoyt Koch, MD  aspirin 81 MG tablet Take 81 mg by mouth daily.    Historical Provider, MD  bisoprolol-hydrochlorothiazide (ZIAC) 5-6.25 MG tablet Take 1 tablet by mouth daily. 04/11/16   Hoyt Koch, MD  cholecalciferol (VITAMIN D) 1000 units tablet Take 1,000 Units by mouth daily.    Historical Provider, MD  Multiple Vitamin (MULTIVITAMIN WITH MINERALS) TABS tablet Take 1 tablet by mouth daily.    Historical Provider, MD  ranitidine (ZANTAC) 300 MG tablet Take 1 tablet (300 mg total) by mouth 2 (two) times daily. 04/11/16   Hoyt Koch, MD   Meds Ordered and Administered this Visit  Medications - No data to display  BP 161/89 (BP Location: Right Arm) Comment (BP Location): large cuff  Pulse 82   Temp 99 F (37.2 C) (Oral)   Resp 20   SpO2 100%  No data found.   Physical Exam  Constitutional: She is oriented to person, place, and time. She appears well-developed and well-nourished.  HENT:  Head: Normocephalic and atraumatic.  Right Ear: External ear normal.  Left Ear: External ear normal.  Nose: Nose normal.  Mouth/Throat: Oropharynx is clear and moist. No oropharyngeal exudate.  TM normal bilaterally with no erythema  Eyes: Conjunctivae are normal. Pupils are equal, round, and reactive to light.  Neck: Normal range of motion.  Cardiovascular: Normal rate, regular rhythm and normal heart sounds.   Pulmonary/Chest: Effort normal and breath sounds normal. No respiratory distress. She has no wheezes.  Abdominal: Soft. Bowel sounds are normal. She exhibits no distension. There is no tenderness.  Musculoskeletal: Normal range of motion.  Right rib area non-tender to palpate. No swelling  or skin discoloration noted  Lymphadenopathy:    She has no cervical adenopathy.  Neurological: She is alert and oriented to person, place, and time.  Skin: Skin is warm and dry.  Nursing note and vitals reviewed.   Urgent Care Course     Procedures (including critical care time)  Labs Review Labs Reviewed - No data to display  Imaging Review No results found.   MDM   1. Rib pain on right side    Most likely MSK from excessive cough. Offered Tussionex but patient states that she will use the robitussin that she just purchased today. Take ibuprofen/tylenol for pain relief. Informed that if SOB develops; then to go to ER. Otherwise take f/u with PCP if symptoms does not improve.    Barry Dienes, NP 09/24/16 1818

## 2016-09-24 NOTE — ED Triage Notes (Signed)
Symptoms started Wednesday, reports sneezing this morning and now has a sharp pain in right side/right lower ribcage.  Patient has a cough.

## 2016-12-06 ENCOUNTER — Encounter: Payer: Self-pay | Admitting: Internal Medicine

## 2016-12-06 ENCOUNTER — Ambulatory Visit (INDEPENDENT_AMBULATORY_CARE_PROVIDER_SITE_OTHER): Payer: 59 | Admitting: Internal Medicine

## 2016-12-06 VITALS — BP 142/76 | HR 64 | Temp 98.3°F | Resp 12 | Ht 66.0 in | Wt 206.0 lb

## 2016-12-06 DIAGNOSIS — M25562 Pain in left knee: Secondary | ICD-10-CM

## 2016-12-06 MED ORDER — METHYLPREDNISOLONE ACETATE 40 MG/ML IJ SUSP
40.0000 mg | Freq: Once | INTRAMUSCULAR | Status: AC
  Administered 2016-12-06: 40 mg via INTRAMUSCULAR

## 2016-12-06 NOTE — Progress Notes (Signed)
Pre visit review using our clinic review tool, if applicable. No additional management support is needed unless otherwise documented below in the visit note. 

## 2016-12-06 NOTE — Patient Instructions (Signed)
You can try taking turmeric and glucosamine over the counter to help with the knee long term.   We have given you a steroid shot today which should help out.

## 2016-12-07 DIAGNOSIS — M25562 Pain in left knee: Secondary | ICD-10-CM | POA: Insufficient documentation

## 2016-12-07 NOTE — Progress Notes (Signed)
   Subjective:    Patient ID: Gina Padilla, female    DOB: 02-04-1961, 56 y.o.   MRN: [id number]  HPI The patient is a 56 YO female coming in for left knee pain. She has had evaluation and treatment from orthopedics in the past with bursitis. Going on now for about 1 month. She has been recently doing some more exercise but no injury or overuse. She denies swelling in the knee. She has been told that she needs a knee replacement in the past with orthopedics but she is not sure that she wants to pursue that as she is so young. Has had injections in her knee in the past.   Review of Systems  Constitutional: Negative for activity change, appetite change, fatigue, fever and unexpected weight change.  Respiratory: Negative.   Cardiovascular: Negative.   Gastrointestinal: Negative.   Musculoskeletal: Positive for arthralgias. Negative for back pain, gait problem, myalgias, neck pain and neck stiffness.  Skin: Negative.   Neurological: Negative.       Objective:   Physical Exam  Constitutional: She is oriented to person, place, and time. She appears well-developed and well-nourished.  HENT:  Head: Normocephalic and atraumatic.  Eyes: EOM are normal.  Neck: Normal range of motion.  Cardiovascular: Normal rate and regular rhythm.   Pulmonary/Chest: Effort normal.  Abdominal: Soft.  Musculoskeletal: She exhibits tenderness.  Mild tenderness in the lateral left knee, no effusion, no baker's cyst detected, ligaments intact  Neurological: She is alert and oriented to person, place, and time.  Skin: Skin is warm and dry.   Vitals:   12/06/16 1429  BP: (!) 142/76  Pulse: 64  Resp: 12  Temp: 98.3 F (36.8 C)  TempSrc: Oral  SpO2: 98%  Weight: 206 lb (93.4 kg)  Height: 5\' 6"  (1.676 m)      Assessment & Plan:  Depo-medrol 40 mg IM given at visit.

## 2016-12-07 NOTE — Assessment & Plan Note (Signed)
Depo-medrol 40 mg IM given at visit. She is encouraged to use ice and ibuprofen and if persistent symptoms to return to her orthopedic provider and offered to refer to a second provider to see if arthroscopic surgery could be helpful.

## 2017-01-28 ENCOUNTER — Other Ambulatory Visit: Payer: Self-pay | Admitting: Internal Medicine

## 2017-04-13 ENCOUNTER — Encounter: Payer: 59 | Admitting: Internal Medicine

## 2017-04-20 ENCOUNTER — Encounter: Payer: Self-pay | Admitting: Internal Medicine

## 2017-04-20 ENCOUNTER — Ambulatory Visit (INDEPENDENT_AMBULATORY_CARE_PROVIDER_SITE_OTHER): Payer: 59 | Admitting: Internal Medicine

## 2017-04-20 VITALS — BP 150/90 | HR 69 | Temp 98.3°F | Ht 66.0 in | Wt 204.0 lb

## 2017-04-20 DIAGNOSIS — R7303 Prediabetes: Secondary | ICD-10-CM

## 2017-04-20 DIAGNOSIS — Z0000 Encounter for general adult medical examination without abnormal findings: Secondary | ICD-10-CM

## 2017-04-20 DIAGNOSIS — K219 Gastro-esophageal reflux disease without esophagitis: Secondary | ICD-10-CM | POA: Diagnosis not present

## 2017-04-20 DIAGNOSIS — I10 Essential (primary) hypertension: Secondary | ICD-10-CM

## 2017-04-20 DIAGNOSIS — Z23 Encounter for immunization: Secondary | ICD-10-CM

## 2017-04-20 NOTE — Assessment & Plan Note (Signed)
Flu shot given at visit. Counseled about shingrix. Tdap, mammogram, colonoscopy up to date. Counseled about sun safety and mole surveillance. Given screening recommendations.

## 2017-04-20 NOTE — Assessment & Plan Note (Signed)
Taking zantac BID with decent control.

## 2017-04-20 NOTE — Assessment & Plan Note (Signed)
BP borderline today and she has not taken all meds. She will monitor at home and if high will increase hydrochlorothiazide. Taking bisoprolol/hctz and amlodipine/benazepril. Checking CMP and adjust as needed.

## 2017-04-20 NOTE — Assessment & Plan Note (Signed)
Checking HgA1c and adjust as needed.  

## 2017-04-20 NOTE — Progress Notes (Signed)
   Subjective:    Patient ID: Gina Padilla, female    DOB: July 14, 1961, 56 y.o.   MRN: [id number]  HPI The patient is a 56 YO female coming in for wellness. No new concerns.   PMH, [facility], social history reviewed and updated.   Review of Systems  Constitutional: Negative.   HENT: Negative.   Eyes: Negative.   Respiratory: Negative for cough, chest tightness and shortness of breath.   Cardiovascular: Negative for chest pain, palpitations and leg swelling.  Gastrointestinal: Negative for abdominal distention, abdominal pain, constipation, diarrhea, nausea and vomiting.  Musculoskeletal: Negative.   Skin: Negative.   Neurological: Negative.   Psychiatric/Behavioral: Negative.       Objective:   Physical Exam  Constitutional: She is oriented to person, place, and time. She appears well-developed and well-nourished.  HENT:  Head: Normocephalic and atraumatic.  Eyes: EOM are normal.  Neck: Normal range of motion.  Cardiovascular: Normal rate and regular rhythm.   Pulmonary/Chest: Effort normal and breath sounds normal. No respiratory distress. She has no wheezes. She has no rales.  Abdominal: Soft. Bowel sounds are normal. She exhibits no distension. There is no tenderness. There is no rebound.  Musculoskeletal: She exhibits no edema.  Neurological: She is alert and oriented to person, place, and time. Coordination normal.  Skin: Skin is warm and dry.  Psychiatric: She has a normal mood and affect.   Vitals:   04/20/17 1105 04/20/17 1138  BP: (!) 150/92 (!) 150/90  Pulse: 69   Temp: 98.3 F (36.8 C)   TempSrc: Oral   SpO2: 98%   Weight: 204 lb (92.5 kg)   Height: 5\' 6"  (1.676 m)       Assessment & Plan:  Flu shot given at visit

## 2017-04-20 NOTE — Patient Instructions (Signed)
WE will have you come back to check the labs.   Think about getting the shingles vaccine called shingrix to protect against shingles.   Health Maintenance, Female Adopting a healthy lifestyle and getting preventive care can go a long way to promote health and wellness. Talk with your health care provider about what schedule of regular examinations is right for you. This is a good chance for you to check in with your provider about disease prevention and staying healthy. In between checkups, there are plenty of things you can do on your own. Experts have done a lot of research about which lifestyle changes and preventive measures are most likely to keep you healthy. Ask your health care provider for more information. Weight and diet Eat a healthy diet  Be sure to include plenty of vegetables, fruits, low-fat dairy products, and lean protein.  Do not eat a lot of foods high in solid fats, added sugars, or salt.  Get regular exercise. This is one of the most important things you can do for your health. ? Most adults should exercise for at least 150 minutes each week. The exercise should increase your heart rate and make you sweat (moderate-intensity exercise). ? Most adults should also do strengthening exercises at least twice a week. This is in addition to the moderate-intensity exercise.  Maintain a healthy weight  Body mass index (BMI) is a measurement that can be used to identify possible weight problems. It estimates body fat based on height and weight. Your health care provider can help determine your BMI and help you achieve or maintain a healthy weight.  For females 83 years of age and older: ? A BMI below 18.5 is considered underweight. ? A BMI of 18.5 to 24.9 is normal. ? A BMI of 25 to 29.9 is considered overweight. ? A BMI of 30 and above is considered obese.  Watch levels of cholesterol and blood lipids  You should start having your blood tested for lipids and cholesterol at  54 years of age, then have this test every 5 years.  You may need to have your cholesterol levels checked more often if: ? Your lipid or cholesterol levels are high. ? You are older than 53 years of age. ? You are at high risk for heart disease.  Cancer screening Lung Cancer  Lung cancer screening is recommended for adults 50-76 years old who are at high risk for lung cancer because of a history of smoking.  A yearly low-dose CT scan of the lungs is recommended for people who: ? Currently smoke. ? Have quit within the past 15 years. ? Have at least a 30-pack-year history of smoking. A pack year is smoking an average of one pack of cigarettes a day for 1 year.  Yearly screening should continue until it has been 15 years since you quit.  Yearly screening should stop if you develop a health problem that would prevent you from having lung cancer treatment.  Breast Cancer  Practice breast self-awareness. This means understanding how your breasts normally appear and feel.  It also means doing regular breast self-exams. Let your health care provider know about any changes, no matter how small.  If you are in your 20s or 30s, you should have a clinical breast exam (CBE) by a health care provider every 1-3 years as part of a regular health exam.  If you are 63 or older, have a CBE every year. Also consider having a breast X-ray (mammogram) every year.  If you have a family history of breast cancer, talk to your health care provider about genetic screening.  If you are at high risk for breast cancer, talk to your health care provider about having an MRI and a mammogram every year.  Breast cancer gene (BRCA) assessment is recommended for women who have family members with BRCA-related cancers. BRCA-related cancers include: ? Breast. ? Ovarian. ? Tubal. ? Peritoneal cancers.  Results of the assessment will determine the need for genetic counseling and BRCA1 and BRCA2 testing.  Cervical  Cancer Your health care provider may recommend that you be screened regularly for cancer of the pelvic organs (ovaries, uterus, and vagina). This screening involves a pelvic examination, including checking for microscopic changes to the surface of your cervix (Pap test). You may be encouraged to have this screening done every 3 years, beginning at age 28.  For women ages 42-65, health care providers may recommend pelvic exams and Pap testing every 3 years, or they may recommend the Pap and pelvic exam, combined with testing for human papilloma virus (HPV), every 5 years. Some types of HPV increase your risk of cervical cancer. Testing for HPV may also be done on women of any age with unclear Pap test results.  Other health care providers may not recommend any screening for nonpregnant women who are considered low risk for pelvic cancer and who do not have symptoms. Ask your health care provider if a screening pelvic exam is right for you.  If you have had past treatment for cervical cancer or a condition that could lead to cancer, you need Pap tests and screening for cancer for at least 20 years after your treatment. If Pap tests have been discontinued, your risk factors (such as having a new sexual partner) need to be reassessed to determine if screening should resume. Some women have medical problems that increase the chance of getting cervical cancer. In these cases, your health care provider may recommend more frequent screening and Pap tests.  Colorectal Cancer  This type of cancer can be detected and often prevented.  Routine colorectal cancer screening usually begins at 54 years of age and continues through 54 years of age.  Your health care provider may recommend screening at an earlier age if you have risk factors for colon cancer.  Your health care provider may also recommend using home test kits to check for hidden blood in the stool.  A small camera at the end of a tube can be used to  examine your colon directly (sigmoidoscopy or colonoscopy). This is done to check for the earliest forms of colorectal cancer.  Routine screening usually begins at age 21.  Direct examination of the colon should be repeated every 5-10 years through 54 years of age. However, you may need to be screened more often if early forms of precancerous polyps or small growths are found.  Skin Cancer  Check your skin from head to toe regularly.  Tell your health care provider about any new moles or changes in moles, especially if there is a change in a mole's shape or color.  Also tell your health care provider if you have a mole that is larger than the size of a pencil eraser.  Always use sunscreen. Apply sunscreen liberally and repeatedly throughout the day.  Protect yourself by wearing long sleeves, pants, a wide-brimmed hat, and sunglasses whenever you are outside.  Heart disease, diabetes, and high blood pressure  High blood pressure causes heart disease and  increases the risk of stroke. High blood pressure is more likely to develop in: ? People who have blood pressure in the high end of the normal range (130-139/85-89 mm Hg). ? People who are overweight or obese. ? People who are African American.  If you are 73-54 years of age, have your blood pressure checked every 3-5 years. If you are 23 years of age or older, have your blood pressure checked every year. You should have your blood pressure measured twice-once when you are at a hospital or clinic, and once when you are not at a hospital or clinic. Record the average of the two measurements. To check your blood pressure when you are not at a hospital or clinic, you can use: ? An automated blood pressure machine at a pharmacy. ? A home blood pressure monitor.  If you are between 67 years and 20 years old, ask your health care provider if you should take aspirin to prevent strokes.  Have regular diabetes screenings. This involves taking a  blood sample to check your fasting blood sugar level. ? If you are at a normal weight and have a low risk for diabetes, have this test once every three years after 54 years of age. ? If you are overweight and have a high risk for diabetes, consider being tested at a younger age or more often. Preventing infection Hepatitis B  If you have a higher risk for hepatitis B, you should be screened for this virus. You are considered at high risk for hepatitis B if: ? You were born in a country where hepatitis B is common. Ask your health care provider which countries are considered high risk. ? Your parents were born in a high-risk country, and you have not been immunized against hepatitis B (hepatitis B vaccine). ? You have HIV or AIDS. ? You use needles to inject street drugs. ? You live with someone who has hepatitis B. ? You have had sex with someone who has hepatitis B. ? You get hemodialysis treatment. ? You take certain medicines for conditions, including cancer, organ transplantation, and autoimmune conditions.  Hepatitis C  Blood testing is recommended for: ? Everyone born from [birth year] through 1963. ? Anyone with known risk factors for hepatitis C.  Sexually transmitted infections (STIs)  You should be screened for sexually transmitted infections (STIs) including gonorrhea and chlamydia if: ? You are sexually active and are younger than 54 years of age. ? You are older than 54 years of age and your health care provider tells you that you are at risk for this type of infection. ? Your sexual activity has changed since you were last screened and you are at an increased risk for chlamydia or gonorrhea. Ask your health care provider if you are at risk.  If you do not have HIV, but are at risk, it may be recommended that you take a prescription medicine daily to prevent HIV infection. This is called pre-exposure prophylaxis (PrEP). You are considered at risk if: ? You are sexually active and  do not regularly use condoms or know the HIV status of your partner(s). ? You take drugs by injection. ? You are sexually active with a partner who has HIV.  Talk with your health care provider about whether you are at high risk of being infected with HIV. If you choose to begin PrEP, you should first be tested for HIV. You should then be tested every 3 months for as long as you are taking  PrEP. Pregnancy  If you are premenopausal and you may become pregnant, ask your health care provider about preconception counseling.  If you may become pregnant, take 400 to 800 micrograms (mcg) of folic acid every day.  If you want to prevent pregnancy, talk to your health care provider about birth control (contraception). Osteoporosis and menopause  Osteoporosis is a disease in which the bones lose minerals and strength with aging. This can result in serious bone fractures. Your risk for osteoporosis can be identified using a bone density scan.  If you are 45 years of age or older, or if you are at risk for osteoporosis and fractures, ask your health care provider if you should be screened.  Ask your health care provider whether you should take a calcium or vitamin D supplement to lower your risk for osteoporosis.  Menopause may have certain physical symptoms and risks.  Hormone replacement therapy may reduce some of these symptoms and risks. Talk to your health care provider about whether hormone replacement therapy is right for you. Follow these instructions at home:  Schedule regular health, dental, and eye exams.  Stay current with your immunizations.  Do not use any tobacco products including cigarettes, chewing tobacco, or electronic cigarettes.  If you are pregnant, do not drink alcohol.  If you are breastfeeding, limit how much and how often you drink alcohol.  Limit alcohol intake to no more than 1 drink per day for nonpregnant women. One drink equals 12 ounces of beer, 5 ounces of  wine, or 1 ounces of hard liquor.  Do not use street drugs.  Do not share needles.  Ask your health care provider for help if you need support or information about quitting drugs.  Tell your health care provider if you often feel depressed.  Tell your health care provider if you have ever been abused or do not feel safe at home. This information is not intended to replace advice given to you by your health care provider. Make sure you discuss any questions you have with your health care provider. Document Released: 01/31/2011 Document Revised: 12/24/2015 Document Reviewed: 04/21/2015 Elsevier Interactive Patient Education  [employer].

## 2017-05-05 ENCOUNTER — Other Ambulatory Visit (INDEPENDENT_AMBULATORY_CARE_PROVIDER_SITE_OTHER): Payer: 59

## 2017-05-05 DIAGNOSIS — Z0000 Encounter for general adult medical examination without abnormal findings: Secondary | ICD-10-CM

## 2017-05-05 LAB — COMPREHENSIVE METABOLIC PANEL WITH GFR
ALT: 18 U/L (ref 0–35)
AST: 17 U/L (ref 0–37)
Albumin: 4.3 g/dL (ref 3.5–5.2)
Alkaline Phosphatase: 68 U/L (ref 39–117)
BUN: 11 mg/dL (ref 6–23)
CO2: 31 meq/L (ref 19–32)
Calcium: 9.6 mg/dL (ref 8.4–10.5)
Chloride: 102 meq/L (ref 96–112)
Creatinine, Ser: 1.01 mg/dL (ref 0.40–1.20)
GFR: 73.41 mL/min
Glucose, Bld: 111 mg/dL — ABNORMAL HIGH (ref 70–99)
Potassium: 3.6 meq/L (ref 3.5–5.1)
Sodium: 140 meq/L (ref 135–145)
Total Bilirubin: 0.4 mg/dL (ref 0.2–1.2)
Total Protein: 7.7 g/dL (ref 6.0–8.3)

## 2017-05-05 LAB — LIPID PANEL
Cholesterol: 171 mg/dL (ref 0–200)
HDL: 56.2 mg/dL
LDL Cholesterol: 96 mg/dL (ref 0–99)
NonHDL: 114.38
Total CHOL/HDL Ratio: 3
Triglycerides: 94 mg/dL (ref 0.0–149.0)
VLDL: 18.8 mg/dL (ref 0.0–40.0)

## 2017-05-05 LAB — CBC
HCT: 40.8 % (ref 36.0–46.0)
Hemoglobin: 13.5 g/dL (ref 12.0–15.0)
MCHC: 33.2 g/dL (ref 30.0–36.0)
MCV: 90 fl (ref 78.0–100.0)
Platelets: 293 10*3/uL (ref 150.0–400.0)
RBC: 4.53 Mil/uL (ref 3.87–5.11)
RDW: 13.1 % (ref 11.5–15.5)
WBC: 6.5 10*3/uL (ref 4.0–10.5)

## 2017-05-05 LAB — HEMOGLOBIN A1C: Hgb A1c MFr Bld: 6.5 % (ref 4.6–6.5)

## 2017-05-10 ENCOUNTER — Other Ambulatory Visit: Payer: Self-pay | Admitting: Internal Medicine

## 2017-05-10 DIAGNOSIS — R7303 Prediabetes: Secondary | ICD-10-CM

## 2017-06-15 ENCOUNTER — Telehealth: Payer: Self-pay

## 2017-06-15 ENCOUNTER — Telehealth: Payer: Self-pay | Admitting: Internal Medicine

## 2017-06-15 MED ORDER — AMLODIPINE BESY-BENAZEPRIL HCL 10-40 MG PO CAPS: 1.0000 | ORAL_CAPSULE | Freq: Every day | ORAL | 1 refills | Status: DC

## 2017-06-15 MED ORDER — BISOPROLOL-HYDROCHLOROTHIAZIDE 10-6.25 MG PO TABS: 1.0000 | ORAL_TABLET | Freq: Every day | ORAL | 1 refills | Status: DC

## 2017-06-15 NOTE — Telephone Encounter (Signed)
Since pt's last appointment her blood pressure has remained high. She said that last night it was 158/98 but she was able to get it down to 133/80. Today it was 160/100. She wanted to know if she should change the dose of her medication. Please advise.

## 2017-06-15 NOTE — Telephone Encounter (Signed)
LVM informing patient of MD response and to call back if there are any questions

## 2017-06-15 NOTE — Telephone Encounter (Signed)
BP was 148/98 patient states it has been running between that and 160/100 lately and that she used to be on 10mg  of both her BP meds before it was changed. Wants to know if it should be changed back to that

## 2017-06-15 NOTE — Telephone Encounter (Signed)
[facility] in the meds [facility]. Stop taking old doses once new arrive

## 2017-06-16 ENCOUNTER — Other Ambulatory Visit: Payer: Self-pay

## 2017-06-16 MED ORDER — BISOPROLOL-HYDROCHLOROTHIAZIDE 10-6.25 MG PO TABS: 1.0000 | ORAL_TABLET | Freq: Every day | ORAL | 1 refills | Status: DC

## 2017-06-16 MED ORDER — BISOPROLOL-HYDROCHLOROTHIAZIDE 10-6.25 MG PO TABS: 1.0000 | ORAL_TABLET | Freq: Every day | ORAL | 0 refills | Status: DC

## 2017-06-16 MED ORDER — AMLODIPINE BESY-BENAZEPRIL HCL 10-40 MG PO CAPS: 1.0000 | ORAL_CAPSULE | Freq: Every day | ORAL | 1 refills | Status: DC

## 2017-06-16 MED ORDER — AMLODIPINE BESY-BENAZEPRIL HCL 10-40 MG PO CAPS: 1.0000 | ORAL_CAPSULE | Freq: Every day | ORAL | 0 refills | Status: DC

## 2017-06-16 NOTE — Telephone Encounter (Signed)
Pt called and her medicines were sent to the wrong pharmacy, please resend both meds to [facility], she spoke to them and it will take 7-10 days for her to get them , she would like a 30 days supply of both sent to the [facility] on her chart  Please advise

## 2017-06-16 NOTE — Telephone Encounter (Signed)
[facility] supply [facility]

## 2017-06-26 ENCOUNTER — Ambulatory Visit: Payer: 59 | Admitting: Registered"

## 2017-06-27 ENCOUNTER — Ambulatory Visit: Payer: 59 | Admitting: Dietician

## 2017-08-09 ENCOUNTER — Other Ambulatory Visit: Payer: Self-pay | Admitting: Obstetrics and Gynecology

## 2017-08-09 DIAGNOSIS — Z139 Encounter for screening, unspecified: Secondary | ICD-10-CM

## 2017-08-31 ENCOUNTER — Ambulatory Visit
Admission: RE | Admit: 2017-08-31 | Discharge: 2017-08-31 | Disposition: A | Discharge status: Home / Self Care | Payer: 59 | Source: Ambulatory Visit | Attending: Obstetrics and Gynecology | Admitting: Obstetrics and Gynecology

## 2017-08-31 DIAGNOSIS — Z139 Encounter for screening, unspecified: Secondary | ICD-10-CM

## 2017-12-04 ENCOUNTER — Other Ambulatory Visit: Payer: Self-pay | Admitting: Internal Medicine

## 2018-02-15 DIAGNOSIS — D171 Benign lipomatous neoplasm of skin and subcutaneous tissue of trunk: Secondary | ICD-10-CM | POA: Insufficient documentation

## 2019-04-03 ENCOUNTER — Other Ambulatory Visit: Payer: Self-pay | Admitting: Physician Assistant

## 2019-04-03 DIAGNOSIS — Z1231 Encounter for screening mammogram for malignant neoplasm of breast: Secondary | ICD-10-CM

## 2019-05-20 ENCOUNTER — Ambulatory Visit
Admission: RE | Admit: 2019-05-20 | Discharge: 2019-05-20 | Disposition: A | Discharge status: Home / Self Care | Source: Ambulatory Visit | Attending: Physician Assistant | Admitting: Physician Assistant

## 2019-05-20 ENCOUNTER — Other Ambulatory Visit: Payer: Self-pay

## 2019-05-20 DIAGNOSIS — Z1231 Encounter for screening mammogram for malignant neoplasm of breast: Secondary | ICD-10-CM

## 2019-08-28 DIAGNOSIS — J301 Allergic rhinitis due to pollen: Secondary | ICD-10-CM | POA: Insufficient documentation

## 2019-08-31 DIAGNOSIS — E6609 Other obesity due to excess calories: Secondary | ICD-10-CM | POA: Insufficient documentation

## 2019-09-10 ENCOUNTER — Other Ambulatory Visit

## 2020-01-08 ENCOUNTER — Telehealth: Payer: Self-pay | Admitting: Internal Medicine

## 2020-01-08 NOTE — Telephone Encounter (Signed)
Patient contacted our office to see if she can gt a copy of her immunization records. I had Margarett check the [place] registry and there is no records there. I contacted the patient and left her a message to check with the health department or go back to her high school.

## 2020-06-04 ENCOUNTER — Other Ambulatory Visit: Payer: Self-pay | Admitting: Physician Assistant

## 2020-06-04 DIAGNOSIS — Z1231 Encounter for screening mammogram for malignant neoplasm of breast: Secondary | ICD-10-CM

## 2020-07-15 ENCOUNTER — Other Ambulatory Visit: Payer: Self-pay

## 2020-07-15 ENCOUNTER — Ambulatory Visit
Admission: RE | Admit: 2020-07-15 | Discharge: 2020-07-15 | Disposition: A | Discharge status: Home / Self Care | Payer: Managed Care, Other (non HMO) | Source: Ambulatory Visit | Attending: Physician Assistant | Admitting: Physician Assistant

## 2020-07-15 DIAGNOSIS — Z1231 Encounter for screening mammogram for malignant neoplasm of breast: Secondary | ICD-10-CM

## 2020-07-20 ENCOUNTER — Other Ambulatory Visit: Payer: Self-pay | Admitting: Physician Assistant

## 2020-07-20 DIAGNOSIS — R928 Other abnormal and inconclusive findings on diagnostic imaging of breast: Secondary | ICD-10-CM

## 2020-07-30 ENCOUNTER — Ambulatory Visit
Admission: RE | Admit: 2020-07-30 | Discharge: 2020-07-30 | Disposition: A | Discharge status: Home / Self Care | Payer: Managed Care, Other (non HMO) | Source: Ambulatory Visit | Attending: Physician Assistant | Admitting: Physician Assistant

## 2020-07-30 ENCOUNTER — Other Ambulatory Visit: Payer: Self-pay | Admitting: Physician Assistant

## 2020-07-30 ENCOUNTER — Other Ambulatory Visit: Payer: Self-pay

## 2020-07-30 DIAGNOSIS — N6310 Unspecified lump in the right breast, unspecified quadrant: Secondary | ICD-10-CM

## 2020-07-30 DIAGNOSIS — R928 Other abnormal and inconclusive findings on diagnostic imaging of breast: Secondary | ICD-10-CM

## 2020-08-11 ENCOUNTER — Ambulatory Visit
Admission: RE | Admit: 2020-08-11 | Discharge: 2020-08-11 | Disposition: A | Discharge status: Home / Self Care | Payer: Managed Care, Other (non HMO) | Source: Ambulatory Visit | Attending: Physician Assistant | Admitting: Physician Assistant

## 2020-08-11 ENCOUNTER — Other Ambulatory Visit: Payer: Self-pay

## 2020-08-11 DIAGNOSIS — N6310 Unspecified lump in the right breast, unspecified quadrant: Secondary | ICD-10-CM

## 2020-08-25 DIAGNOSIS — Z794 Long term (current) use of insulin: Secondary | ICD-10-CM | POA: Insufficient documentation

## 2020-10-29 ENCOUNTER — Ambulatory Visit: Payer: Self-pay | Admitting: Surgery

## 2020-10-29 DIAGNOSIS — N6091 Unspecified benign mammary dysplasia of right breast: Secondary | ICD-10-CM

## 2020-11-02 ENCOUNTER — Other Ambulatory Visit: Payer: Self-pay

## 2020-11-02 ENCOUNTER — Encounter (HOSPITAL_BASED_OUTPATIENT_CLINIC_OR_DEPARTMENT_OTHER): Payer: Self-pay | Admitting: Surgery

## 2020-11-02 NOTE — Progress Notes (Signed)
Spoke with Ivy at Dr Earlie Server office who will contact patient's primary to see if she should hold her ASA 81 mg prior to surgery. Dr Earlie Server office will contact patient if hold is requested.

## 2020-11-04 ENCOUNTER — Other Ambulatory Visit: Payer: Self-pay | Admitting: Surgery

## 2020-11-04 DIAGNOSIS — N6091 Unspecified benign mammary dysplasia of right breast: Secondary | ICD-10-CM

## 2020-11-06 ENCOUNTER — Other Ambulatory Visit (HOSPITAL_COMMUNITY)
Admission: RE | Admit: 2020-11-06 | Discharge: 2020-11-06 | Disposition: A | Discharge status: Home / Self Care | Payer: BC Managed Care – PPO | Source: Ambulatory Visit | Attending: Surgery | Admitting: Surgery

## 2020-11-06 ENCOUNTER — Encounter (HOSPITAL_BASED_OUTPATIENT_CLINIC_OR_DEPARTMENT_OTHER)
Admission: RE | Admit: 2020-11-06 | Discharge: 2020-11-06 | Disposition: A | Discharge status: Home / Self Care | Payer: BC Managed Care – PPO | Source: Ambulatory Visit | Attending: Surgery | Admitting: Surgery

## 2020-11-06 DIAGNOSIS — Z01812 Encounter for preprocedural laboratory examination: Secondary | ICD-10-CM | POA: Insufficient documentation

## 2020-11-06 DIAGNOSIS — Z20822 Contact with and (suspected) exposure to covid-19: Secondary | ICD-10-CM | POA: Insufficient documentation

## 2020-11-06 LAB — BASIC METABOLIC PANEL
Anion gap: 7 (ref 5–15)
BUN: 14 mg/dL (ref 6–20)
CO2: 28 mmol/L (ref 22–32)
Calcium: 9.4 mg/dL (ref 8.9–10.3)
Chloride: 101 mmol/L (ref 98–111)
Creatinine, Ser: 1.08 mg/dL — ABNORMAL HIGH (ref 0.44–1.00)
GFR, Estimated: 60 mL/min — ABNORMAL LOW (ref >60)
Glucose, Bld: 95 mg/dL (ref 70–99)
Potassium: 3.8 mmol/L (ref 3.5–5.1)
Sodium: 136 mmol/L (ref 135–145)

## 2020-11-06 NOTE — Progress Notes (Signed)

## 2020-11-07 LAB — SARS CORONAVIRUS 2 (TAT 6-24 HRS): SARS Coronavirus 2: NEGATIVE

## 2020-11-09 NOTE — H&P (Signed)
Chief Complaint:  Right breast biopsy showing sclerosis and ductal hyperplasia.      History of Present Illness:  Gina Padilla is an 60 y.o. female She was seen in the office a few weeks ago and wanted to proceed with seed guided right breast biopsy.     Past Medical History:  Diagnosis Date  . Anemia   . Arthritis   . GERD (gastroesophageal reflux disease)   . Hypertension     Past Surgical History:  Procedure Laterality Date  . ABDOMINAL HYSTERECTOMY    . CESAREAN SECTION    . FOOT SURGERY    . TUBAL LIGATION      No current facility-administered medications for this encounter.   Current Outpatient Medications  Medication Sig Dispense Refill  . amLODipine-benazepril (LOTREL) 10-40 MG capsule Take 1 capsule daily by mouth. 30 capsule 0  . amLODipine-benazepril (LOTREL) 10-40 MG capsule TAKE 1 CAPSULE BY MOUTH  DAILY 90 capsule 1  . aspirin 81 MG tablet Take 81 mg by mouth daily.    . chlorthalidone (THALITONE) 15 MG tablet Take 15 mg by mouth daily.    . cholecalciferol (VITAMIN D) 1000 units tablet Take 1,000 Units by mouth daily.    . metoprolol tartrate (LOPRESSOR) 25 MG tablet Take 25 mg by mouth 2 (two) times daily.    . Multiple Vitamin (MULTIVITAMIN WITH MINERALS) TABS tablet Take 1 tablet by mouth daily.    . pantoprazole (PROTONIX) 40 MG tablet Take 40 mg by mouth daily.     Codeine phosphate, Hydrochlorothiazide, and Morphine sulfate Family History  Problem Relation Age of Onset  . Peripheral vascular disease Father   . Anuerysm Father   . Diabetes Mother   . Hypertension Mother   . Breast cancer Mother    Social History:   reports that she has never smoked. She has never used smokeless tobacco. She reports current alcohol use. She reports that she does not use drugs.   REVIEW OF SYSTEMS : Negative except for see problem list  Physical Exam:   Height 5\' 6"  (1.676 m), weight 86.6 kg. Body mass index is 30.83 kg/m.  Gen:  WDWN AAF NAD  Neurological:  Alert and oriented to person, place, and time. Motor and sensory function is grossly intact  Head: Normocephalic and atraumatic.  Eyes: Conjunctivae are normal. Pupils are equal, round, and reactive to light. No scleral icterus.  Neck: Normal range of motion. Neck supple. No tracheal deviation or thyromegaly present.  Cardiovascular:  SR without murmurs or gallops.  No carotid bruits Breast:  No palpable masses in either breast.   Respiratory: Effort normal.  No respiratory distress. No chest wall tenderness. Breath sounds normal.  No wheezes, rales or rhonchi.  Abdomen:  nontender  GU:  Not examined Musculoskeletal: Normal range of motion. Extremities are nontender. No cyanosis, edema or clubbing noted Lymphadenopathy: No cervical, preauricular, postauricular or axillary adenopathy is present Skin: Skin is warm and dry. No rash noted. No diaphoresis. No erythema. No pallor. Pscyh: Normal mood and affect. Behavior is normal. Judgment and thought content normal.   LABORATORY RESULTS: No results found for this or any previous visit (from the past 48 hour(s)).   RADIOLOGY RESULTS: No results found.  Problem List: Patient Active Problem List   Diagnosis Date Noted  . Routine general medical examination at a health care facility 04/20/2017  . Left knee pain 12/07/2016  . Prediabetes 08/12/2013  . DEGENERATION, CERVICAL DISC 03/16/2007  . OSTEOARTHRITIS 03/01/2007  .  ANEMIA-IRON DEFICIENCY 02/28/2007  . Essential hypertension 02/28/2007  . 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  For seed localized RBB    Matt B. Hassell Done, MD, [facility] [phone] beeper [phone]  11/09/2020 1:17 PM

## 2020-11-10 ENCOUNTER — Ambulatory Visit
Admission: RE | Admit: 2020-11-10 | Discharge: 2020-11-10 | Disposition: A | Discharge status: Home / Self Care | Source: Ambulatory Visit | Attending: Surgery | Admitting: Surgery

## 2020-11-10 ENCOUNTER — Ambulatory Visit (HOSPITAL_BASED_OUTPATIENT_CLINIC_OR_DEPARTMENT_OTHER): Payer: BC Managed Care – PPO | Admitting: Certified Registered"

## 2020-11-10 ENCOUNTER — Encounter (HOSPITAL_BASED_OUTPATIENT_CLINIC_OR_DEPARTMENT_OTHER): Payer: Self-pay | Admitting: Surgery

## 2020-11-10 ENCOUNTER — Other Ambulatory Visit: Payer: Self-pay

## 2020-11-10 ENCOUNTER — Ambulatory Visit (HOSPITAL_BASED_OUTPATIENT_CLINIC_OR_DEPARTMENT_OTHER)
Admission: RE | Admit: 2020-11-10 | Discharge: 2020-11-10 | Disposition: A | Discharge status: Home / Self Care | Payer: BC Managed Care – PPO | Attending: Surgery | Admitting: Surgery

## 2020-11-10 ENCOUNTER — Encounter (HOSPITAL_BASED_OUTPATIENT_CLINIC_OR_DEPARTMENT_OTHER)
Admission: RE | Disposition: A | Discharge status: Home / Self Care | Payer: Self-pay | Source: Home / Self Care | Attending: Surgery

## 2020-11-10 DIAGNOSIS — Z803 Family history of malignant neoplasm of breast: Secondary | ICD-10-CM | POA: Insufficient documentation

## 2020-11-10 DIAGNOSIS — K219 Gastro-esophageal reflux disease without esophagitis: Secondary | ICD-10-CM | POA: Diagnosis not present

## 2020-11-10 DIAGNOSIS — I10 Essential (primary) hypertension: Secondary | ICD-10-CM | POA: Insufficient documentation

## 2020-11-10 DIAGNOSIS — N6091 Unspecified benign mammary dysplasia of right breast: Secondary | ICD-10-CM

## 2020-11-10 DIAGNOSIS — D509 Iron deficiency anemia, unspecified: Secondary | ICD-10-CM | POA: Diagnosis not present

## 2020-11-10 DIAGNOSIS — Z79899 Other long term (current) drug therapy: Secondary | ICD-10-CM | POA: Insufficient documentation

## 2020-11-10 DIAGNOSIS — R928 Other abnormal and inconclusive findings on diagnostic imaging of breast: Secondary | ICD-10-CM | POA: Diagnosis not present

## 2020-11-10 DIAGNOSIS — N6021 Fibroadenosis of right breast: Secondary | ICD-10-CM | POA: Diagnosis not present

## 2020-11-10 DIAGNOSIS — N6489 Other specified disorders of breast: Secondary | ICD-10-CM | POA: Diagnosis not present

## 2020-11-10 DIAGNOSIS — Z8249 Family history of ischemic heart disease and other diseases of the circulatory system: Secondary | ICD-10-CM | POA: Diagnosis not present

## 2020-11-10 DIAGNOSIS — Z833 Family history of diabetes mellitus: Secondary | ICD-10-CM | POA: Diagnosis not present

## 2020-11-10 DIAGNOSIS — N6011 Diffuse cystic mastopathy of right breast: Secondary | ICD-10-CM | POA: Diagnosis not present

## 2020-11-10 DIAGNOSIS — Z7982 Long term (current) use of aspirin: Secondary | ICD-10-CM | POA: Diagnosis not present

## 2020-11-10 DIAGNOSIS — R921 Mammographic calcification found on diagnostic imaging of breast: Secondary | ICD-10-CM | POA: Diagnosis not present

## 2020-11-10 HISTORY — PX: RADIOACTIVE SEED GUIDED EXCISIONAL BREAST BIOPSY: SHX6490

## 2020-11-10 SURGERY — RADIOACTIVE SEED GUIDED BREAST BIOPSY
Anesthesia: General | Site: Breast | Laterality: Right

## 2020-11-10 MED ORDER — 0.9 % SODIUM CHLORIDE (POUR BTL) OPTIME
TOPICAL | Status: DC | PRN
  Administered 2020-11-10: 1000 mL

## 2020-11-10 MED ORDER — CHLORHEXIDINE GLUCONATE CLOTH 2 % EX PADS: 6.0000 | MEDICATED_PAD | Freq: Once | CUTANEOUS | Status: DC

## 2020-11-10 MED ORDER — FENTANYL CITRATE (PF) 100 MCG/2ML IJ SOLN: 25.0000 ug | INTRAMUSCULAR | Status: DC | PRN

## 2020-11-10 MED ORDER — ACETAMINOPHEN 160 MG/5ML PO SOLN
325.0000 mg | ORAL | Status: DC | PRN
Start: 2020-11-10 — End: 2020-11-10

## 2020-11-10 MED ORDER — BUPIVACAINE HCL (PF) 0.5 % IJ SOLN
INTRAMUSCULAR | Status: DC | PRN
  Administered 2020-11-10: 10 mL

## 2020-11-10 MED ORDER — FENTANYL CITRATE (PF) 100 MCG/2ML IJ SOLN
INTRAMUSCULAR | Status: AC
  Filled 2020-11-10: qty 2

## 2020-11-10 MED ORDER — ONDANSETRON HCL 4 MG/2ML IJ SOLN: 4.0000 mg | Freq: Once | INTRAMUSCULAR | Status: DC | PRN

## 2020-11-10 MED ORDER — FENTANYL CITRATE (PF) 100 MCG/2ML IJ SOLN
INTRAMUSCULAR | Status: DC | PRN
  Administered 2020-11-10: 25 ug via INTRAVENOUS
  Administered 2020-11-10: 50 ug via INTRAVENOUS

## 2020-11-10 MED ORDER — ACETAMINOPHEN 325 MG PO TABS
325.0000 mg | ORAL_TABLET | ORAL | Status: DC | PRN
Start: 2020-11-10 — End: 2020-11-10

## 2020-11-10 MED ORDER — DEXAMETHASONE SODIUM PHOSPHATE 4 MG/ML IJ SOLN
INTRAMUSCULAR | Status: DC | PRN
  Administered 2020-11-10: 10 mg via INTRAVENOUS

## 2020-11-10 MED ORDER — SCOPOLAMINE 1 MG/3DAYS TD PT72
MEDICATED_PATCH | TRANSDERMAL | Status: AC
  Filled 2020-11-10: qty 1

## 2020-11-10 MED ORDER — MIDAZOLAM HCL 2 MG/2ML IJ SOLN
INTRAMUSCULAR | Status: AC
  Filled 2020-11-10: qty 2

## 2020-11-10 MED ORDER — OXYCODONE HCL 5 MG PO TABS: 5.0000 mg | ORAL_TABLET | Freq: Once | ORAL | Status: DC | PRN

## 2020-11-10 MED ORDER — OXYCODONE HCL 5 MG PO TABS: 5.0000 mg | ORAL_TABLET | Freq: Four times a day (QID) | ORAL | 0 refills | Status: DC | PRN

## 2020-11-10 MED ORDER — SCOPOLAMINE 1 MG/3DAYS TD PT72
1.0000 | MEDICATED_PATCH | TRANSDERMAL | Status: DC
  Administered 2020-11-10: 1.5 mg via TRANSDERMAL

## 2020-11-10 MED ORDER — ONDANSETRON HCL 4 MG/2ML IJ SOLN
INTRAMUSCULAR | Status: DC | PRN
  Administered 2020-11-10: 4 mg via INTRAVENOUS

## 2020-11-10 MED ORDER — LACTATED RINGERS IV SOLN: INTRAVENOUS | Status: DC

## 2020-11-10 MED ORDER — KETOROLAC TROMETHAMINE 30 MG/ML IJ SOLN: 30.0000 mg | Freq: Once | INTRAMUSCULAR | Status: DC | PRN

## 2020-11-10 MED ORDER — MEPERIDINE HCL 25 MG/ML IJ SOLN: 6.2500 mg | INTRAMUSCULAR | Status: DC | PRN

## 2020-11-10 MED ORDER — PHENYLEPHRINE HCL (PRESSORS) 10 MG/ML IV SOLN
INTRAVENOUS | Status: DC | PRN
  Administered 2020-11-10 (×3): 120 ug via INTRAVENOUS

## 2020-11-10 MED ORDER — CEFAZOLIN SODIUM-DEXTROSE 2-4 GM/100ML-% IV SOLN
2.0000 g | INTRAVENOUS | Status: AC
  Administered 2020-11-10: 2 g via INTRAVENOUS

## 2020-11-10 MED ORDER — MIDAZOLAM HCL 5 MG/5ML IJ SOLN
INTRAMUSCULAR | Status: DC | PRN
  Administered 2020-11-10: 2 mg via INTRAVENOUS

## 2020-11-10 MED ORDER — BUPIVACAINE HCL (PF) 0.5 % IJ SOLN
INTRAMUSCULAR | Status: AC
  Filled 2020-11-10: qty 30

## 2020-11-10 MED ORDER — PROPOFOL 500 MG/50ML IV EMUL
INTRAVENOUS | Status: DC | PRN
  Administered 2020-11-10: 25 ug/kg/min via INTRAVENOUS

## 2020-11-10 MED ORDER — CEFAZOLIN SODIUM-DEXTROSE 2-4 GM/100ML-% IV SOLN
INTRAVENOUS | Status: AC
  Filled 2020-11-10: qty 100

## 2020-11-10 MED ORDER — OXYCODONE HCL 5 MG/5ML PO SOLN: 5.0000 mg | Freq: Once | ORAL | Status: DC | PRN

## 2020-11-10 SURGICAL SUPPLY — 41 items
ADH SKN CLS APL DERMABOND .7 (GAUZE/BANDAGES/DRESSINGS) ×1
APPLIER CLIP 9.375 MED OPEN (MISCELLANEOUS)
APR CLP MED 9.3 20 MLT OPN (MISCELLANEOUS)
BINDER BREAST XLRG (GAUZE/BANDAGES/DRESSINGS) IMPLANT
BINDER BREAST XXLRG (GAUZE/BANDAGES/DRESSINGS) ×1 IMPLANT
BLADE HEX COATED 2.75 (ELECTRODE) ×2 IMPLANT
BLADE SURG 15 STRL LF DISP TIS (BLADE) ×1 IMPLANT
BLADE SURG 15 STRL SS (BLADE) ×2
CANISTER SUCT 1200ML W/VALVE (MISCELLANEOUS) IMPLANT
CLIP APPLIE 9.375 MED OPEN (MISCELLANEOUS) IMPLANT
COVER BACK TABLE 60X90IN (DRAPES) ×2 IMPLANT
COVER MAYO STAND STRL (DRAPES) ×2 IMPLANT
COVER PROBE W GEL 5X96 (DRAPES) ×2 IMPLANT
COVER WAND RF STERILE (DRAPES) IMPLANT
DERMABOND ADVANCED (GAUZE/BANDAGES/DRESSINGS) ×1
DERMABOND ADVANCED .7 DNX12 (GAUZE/BANDAGES/DRESSINGS) ×1 IMPLANT
DRAPE LAPAROTOMY 100X72 PEDS (DRAPES) ×2 IMPLANT
DRSG PAD ABDOMINAL 8X10 ST (GAUZE/BANDAGES/DRESSINGS) ×2 IMPLANT
ELECT COATED BLADE 2.86 ST (ELECTRODE) ×2 IMPLANT
ELECT REM PT RETURN 9FT ADLT (ELECTROSURGICAL) ×2
ELECTRODE REM PT RTRN 9FT ADLT (ELECTROSURGICAL) ×1 IMPLANT
GLOVE SURG ENC MOIS LTX SZ8 (GLOVE) ×2 IMPLANT
GOWN STRL REUS W/ TWL LRG LVL3 (GOWN DISPOSABLE) ×1 IMPLANT
GOWN STRL REUS W/ TWL XL LVL3 (GOWN DISPOSABLE) ×1 IMPLANT
GOWN STRL REUS W/TWL LRG LVL3 (GOWN DISPOSABLE) ×2
GOWN STRL REUS W/TWL XL LVL3 (GOWN DISPOSABLE) ×2
KIT MARKER MARGIN INK (KITS) ×2 IMPLANT
NDL HYPO 25X1 1.5 SAFETY (NEEDLE) ×1 IMPLANT
NEEDLE HYPO 25X1 1.5 SAFETY (NEEDLE) ×2 IMPLANT
NS IRRIG 1000ML POUR BTL (IV SOLUTION) ×2 IMPLANT
PACK BASIN DAY SURGERY FS (CUSTOM PROCEDURE TRAY) ×2 IMPLANT
PENCIL SMOKE EVACUATOR (MISCELLANEOUS) ×2 IMPLANT
SHEET MEDIUM DRAPE 40X70 STRL (DRAPES) ×2 IMPLANT
SLEEVE SCD COMPRESS KNEE MED (STOCKING) ×2 IMPLANT
SPONGE LAP 18X18 RF (DISPOSABLE) ×3 IMPLANT
SUT MNCRL AB 4-0 PS2 18 (SUTURE) ×2 IMPLANT
SUT VIC AB 4-0 SH 18 (SUTURE) ×2 IMPLANT
TOWEL GREEN STERILE FF (TOWEL DISPOSABLE) ×2 IMPLANT
TRAY FAXITRON CT DISP (TRAY / TRAY PROCEDURE) ×2 IMPLANT
TUBE CONNECTING 20X1/4 (TUBING) IMPLANT
YANKAUER SUCT BULB TIP NO VENT (SUCTIONS) IMPLANT

## 2020-11-10 NOTE — Op Note (Signed)
Gina Padilla  11/04/1960   11/10/2020    PCP:  Hoyt Koch, MD   Surgeon: Kaylyn Lim, MD, FACS  Asst:  none  Anes:  general  Preop Dx: Sclerosing lesion of the right breast  Postop Dx: Path pending  Procedure: Radioactive seed localized right breast biopsy Location Surgery: CDS 6 Complications: None noted  EBL:   20  cc  Drains: none  Description of Procedure:  The patient was taken to OR 6 .  After anesthesia was administered and the patient was prepped  with [employer]  and a timeout was performed.  The breast had been mapped with the probe and right breast incision had been marked.  A right upper outer quadrant incision was made and the dissection performed with the Bovie.  The sclerotic area was more firm and that guided my dissection.  The mass was removed in toto and it contained the hot signal from the seed.  Xray confirmed the seed and the marking coil.  Hemostasis was present and the cavity was reapproximated with 4-0 vicryl.  Marcaine 0.5% was injected into the wound and it was closed in layers with 4-0 vicryl and Dermabond.  A breast  binder was placed.    The patient tolerated the procedure well and was taken to the PACU in stable condition.     Matt B. Hassell Done, [place], [facility], [place]

## 2020-11-10 NOTE — Discharge Instructions (Signed)
Breast Biopsy, Care After These instructions give you information about caring for yourself after your procedure. Your doctor may also give you more specific instructions. Call your doctor if you have any problems or questions after your procedure. What can I expect after the procedure? After your procedure, it is common to have:  Bruising on your breast.  Numbness, tingling, or pain near your biopsy site. Follow these instructions at home: Medicines  Take over-the-counter and prescription medicines only as told by your doctor.  Do not drive for 24 hours if you were given a medicine to help you relax (sedative) during your procedure.  Do not drink alcohol while taking pain medicine.  Do not drive or use heavy machinery while taking prescription pain medicine. Biopsy site care  Follow instructions from your doctor about how to take care of your cut from surgery (incision) or your puncture area. Make sure you: ? Wash your hands with soap and water before you change your bandage (dressing). If you cannot use soap and water, use hand sanitizer. ? Change your bandage as told by your doctor. ? Leave stitches (sutures), skin glue, or skin tape (adhesive strips) in place. They may need to stay in place for 2 weeks or longer. If tape strips get loose and curl up, you may trim the loose edges. Do not remove tape strips completely unless your doctor says it is okay.  If you have stitches, keep them dry when you take a bath or a shower.  Check your cut or puncture area every day for signs of infection. Check for: ? Redness, swelling, or pain. ? Fluid or blood. ? Warmth. ? Pus or a bad smell.  Protect the biopsy area. Do not let the area get bumped.      Activity  If you had a cut during your procedure, avoid activities that could pull your cut open. These include: ? Stretching. ? Reaching over your head. ? Exercise. ? Sports. ? Lifting anything that weighs more than 3 lb (1.4  kg).  Return to your normal activities as told by your doctor. Ask your doctor what activities are safe for you. Managing pain, stiffness, and swelling If told, put ice on the biopsy site to relieve swelling:  Put ice in a plastic bag.  Place a towel between your skin and the bag.  Leave the ice on for 20 minutes, 2-3 times a day. General instructions  Continue your normal diet.  Wear a good support bra for as long as told by your doctor.  Get checked for extra fluid around your lymph nodes (lymphedema) as often as told by your doctor.  Keep all follow-up visits as told by your doctor. This is important. Contact a doctor if:  You notice any of the following at the biopsy site: ? More redness, swelling, or pain. ? More fluid or blood coming from the site. ? The site feels warm to the touch. ? Pus or a bad smell coming from the site. ? The site breaks open after the stitches or skin tape strips have been removed.  You have a rash.  You have a fever. Get help right away if:  You have more bleeding from the biopsy site. Get help right away if bleeding is more than a small spot.  You have trouble breathing.  You have red streaks around the biopsy site. Summary  After your procedure, it is common to have bruising, numbness, tingling, or pain near the biopsy site.  Do not  drive or use heavy machinery while taking prescription pain medicine.  Wear a good support bra for as long as told by your doctor.  If you had a cut during your procedure, avoid activities that may pull the cut open. Ask your doctor what activities are safe for you. This information is not intended to replace advice given to you by your health care provider. Make sure you discuss any questions you have with your health care provider. Document Revised: 03/30/2020 Document Reviewed: 01/04/2018 Elsevier Patient Education  2021 [place] Instructions  Activity: Get plenty  of rest for the remainder of the day. A responsible individual must stay with you for 24 hours following the procedure.  For the next 24 hours, DO NOT: -Drive a car -Paediatric nurse -Drink alcoholic beverages -Take any medication unless instructed by your physician -Make any legal decisions or sign important papers.  Meals: Start with liquid foods such as gelatin or soup. Progress to regular foods as tolerated. Avoid greasy, spicy, heavy foods. If nausea and/or vomiting occur, drink only clear liquids until the nausea and/or vomiting subsides. Call your physician if vomiting continues.  Special Instructions/Symptoms: Your throat may feel dry or sore from the anesthesia or the breathing tube placed in your throat during surgery. If this causes discomfort, gargle with warm salt water. The discomfort should disappear within 24 hours.  If you had a scopolamine patch placed behind your ear for the management of post- operative nausea and/or vomiting:  1. The medication in the patch is effective for 72 hours, after which it should be removed.  Wrap patch in a tissue and discard in the trash. Wash hands thoroughly with soap and water. 2. You may remove the patch earlier than 72 hours if you experience unpleasant side effects which may include dry mouth, dizziness or visual disturbances. 3. Avoid touching the patch. Wash your hands with soap and water after contact with the patch.

## 2020-11-10 NOTE — Anesthesia Postprocedure Evaluation (Signed)
Anesthesia Post Note  Patient: Gina Padilla  Procedure(s) Performed: RADIOACTIVE SEED GUIDED EXCISIONAL RIGHT BREAST BIOPSY (Right Breast)     Patient location during evaluation: Phase II Anesthesia Type: General Level of consciousness: awake Pain management: pain level controlled Vital Signs Assessment: post-procedure vital signs reviewed and stable Respiratory status: spontaneous breathing Cardiovascular status: stable Postop Assessment: no apparent nausea or vomiting Anesthetic complications: no   No complications documented.  Last Vitals:  Vitals:   11/10/20 1445 11/10/20 1503  BP: 121/82 117/69  Pulse: 64 (!) 56  Resp: 14 15  Temp:  (!) 36.2 C  SpO2: 99% 96%    Last Pain:  Vitals:   11/10/20 1503  TempSrc:   PainSc: 0-No pain                 Huston Foley

## 2020-11-10 NOTE — Transfer of Care (Signed)
Immediate Anesthesia Transfer of Care Note  Patient: Gina Padilla  Procedure(s) Performed: RADIOACTIVE SEED GUIDED EXCISIONAL RIGHT BREAST BIOPSY (Right Breast)  Patient Location: PACU  Anesthesia Type:General  Level of Consciousness: awake, alert  and oriented  Airway & Oxygen Therapy: Patient Spontanous Breathing and Patient connected to face mask oxygen  Post-op Assessment: Report given to RN and Post -op Vital signs reviewed and stable  Post vital signs: Reviewed and stable  Last Vitals:  Vitals Value Taken Time  BP    Temp    Pulse    Resp    SpO2      Last Pain:  Vitals:   11/10/20 1051  TempSrc: Oral  PainSc: 0-No pain      Patients Stated Pain Goal: 7 (25/85/27 7824)  Complications: No complications documented.

## 2020-11-10 NOTE — Interval H&P Note (Signed)
History and Physical Interval Note:  11/10/2020 12:33 PM  Gina Padilla  has presented today for surgery, with the diagnosis of [place].  The various methods of treatment have been discussed with the patient and family. After consideration of risks, benefits and other options for treatment, the patient has consented to  Procedure(s) with comments: RADIOACTIVE SEED GUIDED EXCISIONAL RIGHT BREAST BIOPSY (Right) - 90 MINUTES ROOM 1 as a surgical intervention.  The patient's history has been reviewed, patient examined, no change in status, stable for surgery.  I have reviewed the patient's chart and labs.  Questions were answered to the patient's satisfaction.     [name]

## 2020-11-10 NOTE — Anesthesia Preprocedure Evaluation (Signed)
Anesthesia Evaluation  Patient identified by MRN, date of birth, ID band Patient awake    Reviewed: Allergy & Precautions, NPO status , Patient's Chart, lab work & pertinent test results  Airway Mallampati: I       Dental no notable dental hx.    Pulmonary neg pulmonary ROS,    Pulmonary exam normal        Cardiovascular hypertension, Pt. on medications Normal cardiovascular exam     Neuro/Psych negative neurological ROS  negative psych ROS   GI/Hepatic Neg liver ROS, GERD  Medicated and Controlled,  Endo/Other  negative endocrine ROS  Renal/GU negative Renal ROS  negative genitourinary   Musculoskeletal   Abdominal Normal abdominal exam  (+)   Peds  Hematology   Anesthesia Other Findings   Reproductive/Obstetrics                             Anesthesia Physical Anesthesia Plan  ASA: II  Anesthesia Plan: General   Post-op Pain Management:    Induction: Intravenous  PONV Risk Score and Plan: 3 and Ondansetron, Dexamethasone and Midazolam  Airway Management Planned: LMA  Additional Equipment: None  Intra-op Plan:   Post-operative Plan: Extubation in OR  Informed Consent: I have reviewed the patients History and Physical, chart, labs and discussed the procedure including the risks, benefits and alternatives for the proposed anesthesia with the patient or authorized representative who has indicated his/her understanding and acceptance.     Dental advisory given  Plan Discussed with: CRNA  Anesthesia Plan Comments:         Anesthesia Quick Evaluation

## 2020-11-11 ENCOUNTER — Encounter (HOSPITAL_BASED_OUTPATIENT_CLINIC_OR_DEPARTMENT_OTHER): Payer: Self-pay | Admitting: Surgery

## 2020-11-12 LAB — SURGICAL PATHOLOGY

## 2021-01-13 DIAGNOSIS — I10 Essential (primary) hypertension: Secondary | ICD-10-CM | POA: Diagnosis not present

## 2021-01-13 DIAGNOSIS — E1159 Type 2 diabetes mellitus with other circulatory complications: Secondary | ICD-10-CM | POA: Diagnosis not present

## 2021-01-13 DIAGNOSIS — Z794 Long term (current) use of insulin: Secondary | ICD-10-CM | POA: Diagnosis not present

## 2021-01-13 DIAGNOSIS — E559 Vitamin D deficiency, unspecified: Secondary | ICD-10-CM | POA: Diagnosis not present

## 2021-01-13 DIAGNOSIS — E119 Type 2 diabetes mellitus without complications: Secondary | ICD-10-CM | POA: Diagnosis not present

## 2021-01-13 DIAGNOSIS — Z23 Encounter for immunization: Secondary | ICD-10-CM | POA: Diagnosis not present

## 2021-01-13 DIAGNOSIS — I152 Hypertension secondary to endocrine disorders: Secondary | ICD-10-CM | POA: Diagnosis not present

## 2021-04-02 DIAGNOSIS — E1169 Type 2 diabetes mellitus with other specified complication: Secondary | ICD-10-CM | POA: Diagnosis not present

## 2021-04-02 DIAGNOSIS — Z794 Long term (current) use of insulin: Secondary | ICD-10-CM | POA: Diagnosis not present

## 2021-04-02 DIAGNOSIS — E1159 Type 2 diabetes mellitus with other circulatory complications: Secondary | ICD-10-CM | POA: Diagnosis not present

## 2021-04-02 DIAGNOSIS — Z23 Encounter for immunization: Secondary | ICD-10-CM | POA: Diagnosis not present

## 2021-04-02 DIAGNOSIS — E119 Type 2 diabetes mellitus without complications: Secondary | ICD-10-CM | POA: Diagnosis not present

## 2021-04-02 DIAGNOSIS — E559 Vitamin D deficiency, unspecified: Secondary | ICD-10-CM | POA: Diagnosis not present

## 2021-05-27 DIAGNOSIS — I10 Essential (primary) hypertension: Secondary | ICD-10-CM | POA: Diagnosis not present

## 2021-05-27 DIAGNOSIS — M1711 Unilateral primary osteoarthritis, right knee: Secondary | ICD-10-CM | POA: Diagnosis not present

## 2021-06-08 DIAGNOSIS — Z13228 Encounter for screening for other metabolic disorders: Secondary | ICD-10-CM | POA: Diagnosis not present

## 2021-06-08 DIAGNOSIS — E668 Other obesity: Secondary | ICD-10-CM | POA: Diagnosis not present

## 2021-06-08 DIAGNOSIS — I10 Essential (primary) hypertension: Secondary | ICD-10-CM | POA: Diagnosis not present

## 2021-06-08 DIAGNOSIS — R7303 Prediabetes: Secondary | ICD-10-CM | POA: Diagnosis not present

## 2021-06-08 DIAGNOSIS — Z6831 Body mass index (BMI) 31.0-31.9, adult: Secondary | ICD-10-CM | POA: Diagnosis not present

## 2021-06-16 DIAGNOSIS — R7303 Prediabetes: Secondary | ICD-10-CM | POA: Diagnosis not present

## 2021-06-16 DIAGNOSIS — Z713 Dietary counseling and surveillance: Secondary | ICD-10-CM | POA: Diagnosis not present

## 2021-06-16 DIAGNOSIS — Z6831 Body mass index (BMI) 31.0-31.9, adult: Secondary | ICD-10-CM | POA: Diagnosis not present

## 2021-06-16 DIAGNOSIS — E6609 Other obesity due to excess calories: Secondary | ICD-10-CM | POA: Diagnosis not present

## 2021-06-30 ENCOUNTER — Ambulatory Visit: Admitting: Internal Medicine

## 2021-07-01 DIAGNOSIS — I10 Essential (primary) hypertension: Secondary | ICD-10-CM | POA: Diagnosis not present

## 2021-07-01 DIAGNOSIS — E559 Vitamin D deficiency, unspecified: Secondary | ICD-10-CM | POA: Diagnosis not present

## 2021-07-01 DIAGNOSIS — E785 Hyperlipidemia, unspecified: Secondary | ICD-10-CM | POA: Diagnosis not present

## 2021-07-01 DIAGNOSIS — Z113 Encounter for screening for infections with a predominantly sexual mode of transmission: Secondary | ICD-10-CM | POA: Diagnosis not present

## 2021-07-01 DIAGNOSIS — Z0000 Encounter for general adult medical examination without abnormal findings: Secondary | ICD-10-CM | POA: Diagnosis not present

## 2021-07-01 DIAGNOSIS — E6609 Other obesity due to excess calories: Secondary | ICD-10-CM | POA: Diagnosis not present

## 2021-07-01 DIAGNOSIS — I152 Hypertension secondary to endocrine disorders: Secondary | ICD-10-CM | POA: Diagnosis not present

## 2021-07-01 DIAGNOSIS — E1169 Type 2 diabetes mellitus with other specified complication: Secondary | ICD-10-CM | POA: Diagnosis not present

## 2021-07-01 DIAGNOSIS — E1159 Type 2 diabetes mellitus with other circulatory complications: Secondary | ICD-10-CM | POA: Diagnosis not present

## 2021-07-05 ENCOUNTER — Other Ambulatory Visit: Payer: Self-pay | Admitting: Physician Assistant

## 2021-07-05 DIAGNOSIS — Z1231 Encounter for screening mammogram for malignant neoplasm of breast: Secondary | ICD-10-CM

## 2021-07-14 DIAGNOSIS — Z713 Dietary counseling and surveillance: Secondary | ICD-10-CM | POA: Diagnosis not present

## 2021-07-14 DIAGNOSIS — R7303 Prediabetes: Secondary | ICD-10-CM | POA: Diagnosis not present

## 2021-07-14 DIAGNOSIS — E6609 Other obesity due to excess calories: Secondary | ICD-10-CM | POA: Diagnosis not present

## 2021-07-14 DIAGNOSIS — Z794 Long term (current) use of insulin: Secondary | ICD-10-CM | POA: Diagnosis not present

## 2021-07-14 DIAGNOSIS — E668 Other obesity: Secondary | ICD-10-CM | POA: Diagnosis not present

## 2021-07-14 DIAGNOSIS — Z6831 Body mass index (BMI) 31.0-31.9, adult: Secondary | ICD-10-CM | POA: Diagnosis not present

## 2021-07-14 DIAGNOSIS — I10 Essential (primary) hypertension: Secondary | ICD-10-CM | POA: Diagnosis not present

## 2021-07-14 DIAGNOSIS — E119 Type 2 diabetes mellitus without complications: Secondary | ICD-10-CM | POA: Diagnosis not present

## 2021-07-29 DIAGNOSIS — E668 Other obesity: Secondary | ICD-10-CM | POA: Diagnosis not present

## 2021-07-29 DIAGNOSIS — Z794 Long term (current) use of insulin: Secondary | ICD-10-CM | POA: Diagnosis not present

## 2021-07-29 DIAGNOSIS — Z6830 Body mass index (BMI) 30.0-30.9, adult: Secondary | ICD-10-CM | POA: Diagnosis not present

## 2021-07-29 DIAGNOSIS — R7303 Prediabetes: Secondary | ICD-10-CM | POA: Diagnosis not present

## 2021-07-29 DIAGNOSIS — E6609 Other obesity due to excess calories: Secondary | ICD-10-CM | POA: Diagnosis not present

## 2021-07-29 DIAGNOSIS — E119 Type 2 diabetes mellitus without complications: Secondary | ICD-10-CM | POA: Diagnosis not present

## 2021-07-29 DIAGNOSIS — E782 Mixed hyperlipidemia: Secondary | ICD-10-CM | POA: Diagnosis not present

## 2021-07-29 DIAGNOSIS — Z713 Dietary counseling and surveillance: Secondary | ICD-10-CM | POA: Diagnosis not present

## 2021-07-29 DIAGNOSIS — I10 Essential (primary) hypertension: Secondary | ICD-10-CM | POA: Diagnosis not present

## 2021-08-09 ENCOUNTER — Other Ambulatory Visit: Payer: Self-pay

## 2021-08-09 ENCOUNTER — Ambulatory Visit
Admission: RE | Admit: 2021-08-09 | Discharge: 2021-08-09 | Disposition: A | Discharge status: Home / Self Care | Payer: BC Managed Care – PPO | Source: Ambulatory Visit | Attending: Physician Assistant | Admitting: Physician Assistant

## 2021-08-09 DIAGNOSIS — Z1231 Encounter for screening mammogram for malignant neoplasm of breast: Secondary | ICD-10-CM

## 2021-08-19 DIAGNOSIS — R7303 Prediabetes: Secondary | ICD-10-CM | POA: Diagnosis not present

## 2021-08-19 DIAGNOSIS — E782 Mixed hyperlipidemia: Secondary | ICD-10-CM | POA: Diagnosis not present

## 2021-08-19 DIAGNOSIS — K219 Gastro-esophageal reflux disease without esophagitis: Secondary | ICD-10-CM | POA: Diagnosis not present

## 2021-08-19 DIAGNOSIS — E668 Other obesity: Secondary | ICD-10-CM | POA: Diagnosis not present

## 2021-08-19 DIAGNOSIS — E6609 Other obesity due to excess calories: Secondary | ICD-10-CM | POA: Diagnosis not present

## 2021-08-19 DIAGNOSIS — I10 Essential (primary) hypertension: Secondary | ICD-10-CM | POA: Diagnosis not present

## 2021-08-19 DIAGNOSIS — Z713 Dietary counseling and surveillance: Secondary | ICD-10-CM | POA: Diagnosis not present

## 2021-08-19 DIAGNOSIS — Z6830 Body mass index (BMI) 30.0-30.9, adult: Secondary | ICD-10-CM | POA: Diagnosis not present

## 2021-08-25 LAB — HM DIABETES EYE EXAM

## 2021-09-23 DIAGNOSIS — Z713 Dietary counseling and surveillance: Secondary | ICD-10-CM | POA: Diagnosis not present

## 2021-09-23 DIAGNOSIS — E1169 Type 2 diabetes mellitus with other specified complication: Secondary | ICD-10-CM | POA: Diagnosis not present

## 2021-09-23 DIAGNOSIS — E668 Other obesity: Secondary | ICD-10-CM | POA: Diagnosis not present

## 2021-09-23 DIAGNOSIS — E1159 Type 2 diabetes mellitus with other circulatory complications: Secondary | ICD-10-CM | POA: Diagnosis not present

## 2021-09-23 DIAGNOSIS — E6609 Other obesity due to excess calories: Secondary | ICD-10-CM | POA: Diagnosis not present

## 2021-09-23 DIAGNOSIS — G479 Sleep disorder, unspecified: Secondary | ICD-10-CM | POA: Diagnosis not present

## 2021-09-23 DIAGNOSIS — Z6830 Body mass index (BMI) 30.0-30.9, adult: Secondary | ICD-10-CM | POA: Diagnosis not present

## 2021-09-23 DIAGNOSIS — E119 Type 2 diabetes mellitus without complications: Secondary | ICD-10-CM | POA: Diagnosis not present

## 2021-09-23 DIAGNOSIS — I10 Essential (primary) hypertension: Secondary | ICD-10-CM | POA: Diagnosis not present

## 2021-10-14 DIAGNOSIS — E1159 Type 2 diabetes mellitus with other circulatory complications: Secondary | ICD-10-CM | POA: Diagnosis not present

## 2021-10-14 DIAGNOSIS — Z794 Long term (current) use of insulin: Secondary | ICD-10-CM | POA: Diagnosis not present

## 2021-10-14 DIAGNOSIS — E785 Hyperlipidemia, unspecified: Secondary | ICD-10-CM | POA: Diagnosis not present

## 2021-11-23 DIAGNOSIS — E6609 Other obesity due to excess calories: Secondary | ICD-10-CM | POA: Diagnosis not present

## 2021-11-23 DIAGNOSIS — E1169 Type 2 diabetes mellitus with other specified complication: Secondary | ICD-10-CM | POA: Diagnosis not present

## 2021-11-23 DIAGNOSIS — E119 Type 2 diabetes mellitus without complications: Secondary | ICD-10-CM | POA: Diagnosis not present

## 2021-11-23 DIAGNOSIS — E1159 Type 2 diabetes mellitus with other circulatory complications: Secondary | ICD-10-CM | POA: Diagnosis not present

## 2021-12-21 ENCOUNTER — Ambulatory Visit (INDEPENDENT_AMBULATORY_CARE_PROVIDER_SITE_OTHER): Payer: BC Managed Care – PPO | Admitting: Family Medicine

## 2021-12-21 ENCOUNTER — Encounter: Payer: Self-pay | Admitting: Family Medicine

## 2021-12-21 VITALS — BP 122/76 | HR 64 | Temp 97.5°F | Ht 66.0 in | Wt 188.0 lb

## 2021-12-21 DIAGNOSIS — T502X5A Adverse effect of carbonic-anhydrase inhibitors, benzothiadiazides and other diuretics, initial encounter: Secondary | ICD-10-CM

## 2021-12-21 DIAGNOSIS — I10 Essential (primary) hypertension: Secondary | ICD-10-CM | POA: Diagnosis not present

## 2021-12-21 DIAGNOSIS — Z794 Long term (current) use of insulin: Secondary | ICD-10-CM | POA: Diagnosis not present

## 2021-12-21 DIAGNOSIS — E119 Type 2 diabetes mellitus without complications: Secondary | ICD-10-CM | POA: Diagnosis not present

## 2021-12-21 DIAGNOSIS — E785 Hyperlipidemia, unspecified: Secondary | ICD-10-CM | POA: Diagnosis not present

## 2021-12-21 DIAGNOSIS — E1169 Type 2 diabetes mellitus with other specified complication: Secondary | ICD-10-CM | POA: Diagnosis not present

## 2021-12-21 DIAGNOSIS — E559 Vitamin D deficiency, unspecified: Secondary | ICD-10-CM | POA: Diagnosis not present

## 2021-12-21 DIAGNOSIS — K219 Gastro-esophageal reflux disease without esophagitis: Secondary | ICD-10-CM | POA: Diagnosis not present

## 2021-12-21 DIAGNOSIS — M25562 Pain in left knee: Secondary | ICD-10-CM

## 2021-12-21 DIAGNOSIS — E876 Hypokalemia: Secondary | ICD-10-CM

## 2021-12-21 LAB — CBC WITH DIFFERENTIAL/PLATELET
Basophils Absolute: 0 10*3/uL (ref 0.0–0.1)
Basophils Relative: 0.6 % (ref 0.0–3.0)
Eosinophils Absolute: 0.2 10*3/uL (ref 0.0–0.7)
Eosinophils Relative: 3.3 % (ref 0.0–5.0)
HCT: 39.2 % (ref 36.0–46.0)
Hemoglobin: 13.4 g/dL (ref 12.0–15.0)
Lymphocytes Relative: 34.9 % (ref 12.0–46.0)
Lymphs Abs: 1.9 10*3/uL (ref 0.7–4.0)
MCHC: 34.2 g/dL (ref 30.0–36.0)
MCV: 88.3 fl (ref 78.0–100.0)
Monocytes Absolute: 0.4 10*3/uL (ref 0.1–1.0)
Monocytes Relative: 7 % (ref 3.0–12.0)
Neutro Abs: 3 10*3/uL (ref 1.4–7.7)
Neutrophils Relative %: 54.2 % (ref 43.0–77.0)
Platelets: 278 10*3/uL (ref 150.0–400.0)
RBC: 4.43 Mil/uL (ref 3.87–5.11)
RDW: 12.6 % (ref 11.5–15.5)
WBC: 5.5 10*3/uL (ref 4.0–10.5)

## 2021-12-21 LAB — COMPREHENSIVE METABOLIC PANEL
ALT: 16 U/L (ref 0–35)
AST: 19 U/L (ref 0–37)
Albumin: 4.4 g/dL (ref 3.5–5.2)
Alkaline Phosphatase: 50 U/L (ref 39–117)
BUN: 16 mg/dL (ref 6–23)
CO2: 34 mEq/L — ABNORMAL HIGH (ref 19–32)
Calcium: 10 mg/dL (ref 8.4–10.5)
Chloride: 100 mEq/L (ref 96–112)
Creatinine, Ser: 1.09 mg/dL (ref 0.40–1.20)
GFR: 55.89 mL/min — ABNORMAL LOW (ref >60.00)
Glucose, Bld: 102 mg/dL — ABNORMAL HIGH (ref 70–99)
Potassium: 3.3 mEq/L — ABNORMAL LOW (ref 3.5–5.1)
Sodium: 140 mEq/L (ref 135–145)
Total Bilirubin: 0.5 mg/dL (ref 0.2–1.2)
Total Protein: 7.6 g/dL (ref 6.0–8.3)

## 2021-12-21 LAB — LIPID PANEL
Cholesterol: 161 mg/dL (ref 0–200)
HDL: 54.6 mg/dL (ref >39.00)
LDL Cholesterol: 84 mg/dL (ref 0–99)
NonHDL: 106.35
Total CHOL/HDL Ratio: 3
Triglycerides: 112 mg/dL (ref 0.0–149.0)
VLDL: 22.4 mg/dL (ref 0.0–40.0)

## 2021-12-21 LAB — TSH: TSH: 1.09 u[IU]/mL (ref 0.35–5.50)

## 2021-12-21 LAB — VITAMIN D 25 HYDROXY (VIT D DEFICIENCY, FRACTURES): VITD: 45.51 ng/mL (ref 30.00–100.00)

## 2021-12-21 LAB — MICROALBUMIN / CREATININE URINE RATIO
Creatinine,U: 130.2 mg/dL
Microalb Creat Ratio: 0.5 mg/g (ref 0.0–30.0)
Microalb, Ur: <0.7 mg/dL (ref 0.0–1.9)

## 2021-12-21 LAB — HEMOGLOBIN A1C: Hgb A1c MFr Bld: 6.2 % (ref 4.6–6.5)

## 2021-12-21 LAB — T4, FREE: Free T4: 0.99 ng/dL (ref 0.60–1.60)

## 2021-12-21 MED ORDER — POTASSIUM CHLORIDE CRYS ER 10 MEQ PO TBCR: 10.0000 meq | EXTENDED_RELEASE_TABLET | Freq: Every day | ORAL | 0 refills | Status: DC

## 2021-12-21 MED ORDER — PANTOPRAZOLE SODIUM 40 MG PO TBEC: 40.0000 mg | DELAYED_RELEASE_TABLET | Freq: Every day | ORAL | 2 refills | Status: DC

## 2021-12-21 MED ORDER — ATORVASTATIN CALCIUM 20 MG PO TABS: 20.0000 mg | ORAL_TABLET | Freq: Every day | ORAL | 1 refills | Status: DC

## 2021-12-21 NOTE — Assessment & Plan Note (Signed)
She has been out of statin x 1 wk. Refill mediation and follow up pending lipid panel.

## 2021-12-21 NOTE — Progress Notes (Signed)
New Patient Office Visit  Subjective    Patient ID: Gina Padilla, female    DOB: 20-Dec-1960  Age: 61 y.o. MRN: [id number]  CC:  Chief Complaint  Patient presents with   Establish Care    Left knee has been bothering her when she gets up to walk it feels as if it wants to give out.   Would also like to discuss her cholesterol medication Lipitor (ran out and was not refilled, hasn't been taking for a week) as recent labs show it has went down.    HPI Gina Padilla presents to establish care.   Diabetes - states she has lowered her A1c with diet and exercise and is not on medication. Last Hgb A1c 5.9% in 07/2021.  She sees a dietician   HL- has been taking Lipitor 20 mg daily. Been out of the medication for approximately one week. Requests refill.   HTN- reports good compliance with chlorthalidone 15 mg daily, amlodipine- benazepril 10-40 mg capsule daily, metoprolol tartrate 25 mg daily. States her BP is well controlled on these medications and no side effects.   States she had stress test and cardiac work up which was negative.   GERD- takes Protonix as needed but is out and needs a refill.  Taking Tums but not as helpful.   Vitamin D def in past. Taking 1,000 IUs of D3 daily   Reports chronic knee pain. Hx of injections in right knee but now her left knee is giving her trouble. She has seen [facility] in the past but now would like to establish care with ortho in [place].   Denies fever, chills, dizziness, chest pain, palpitations, shortness of breath, abdominal pain, N/V/D, urinary symptoms, LE edema.    States she ate this morning- 1/2 bagel with peanut butter and a triple zero yogurt.   Diabetic eye exam done at [facility] care this year.    Occupation: [employer]. She works as a Geophysicist/field seismologist.   Outpatient Encounter Medications as of 12/21/2021  Medication Sig   amLODipine-benazepril (LOTREL) 10-40 MG capsule TAKE 1 CAPSULE BY MOUTH  DAILY   aspirin 81 MG tablet  Take 81 mg by mouth daily.   atorvastatin (LIPITOR) 20 MG tablet Take 1 tablet (20 mg total) by mouth daily.   chlorthalidone (HYGROTEN) 15 MG tablet Take 15 mg by mouth daily.   cholecalciferol (VITAMIN D) 1000 units tablet Take 1,000 Units by mouth daily.   metoprolol tartrate (LOPRESSOR) 25 MG tablet Take 25 mg by mouth 2 (two) times daily.   Multiple Vitamin (MULTIVITAMIN WITH MINERALS) TABS tablet Take 1 tablet by mouth daily.   potassium chloride (KLOR-CON M) 10 MEQ tablet Take 1 tablet (10 mEq total) by mouth daily.   [DISCONTINUED] pantoprazole (PROTONIX) 40 MG tablet Take 40 mg by mouth daily.   pantoprazole (PROTONIX) 40 MG tablet Take 1 tablet (40 mg total) by mouth daily.   [DISCONTINUED] amLODipine-benazepril (LOTREL) 10-40 MG capsule Take 1 capsule daily by mouth.   [DISCONTINUED] oxyCODONE (OXY IR/ROXICODONE) 5 MG immediate release tablet Take 1 tablet (5 mg total) by mouth every 6 (six) hours as needed for severe pain.   No facility-administered encounter medications on file as of 12/21/2021.    Past Medical History:  Diagnosis Date   Anemia    Arthritis    GERD (gastroesophageal reflux disease)    Hypertension     Past Surgical History:  Procedure Laterality Date   ABDOMINAL HYSTERECTOMY  CESAREAN SECTION     FOOT SURGERY     RADIOACTIVE SEED GUIDED EXCISIONAL BREAST BIOPSY Right 11/10/2020   Procedure: RADIOACTIVE SEED GUIDED EXCISIONAL RIGHT BREAST BIOPSY;  Surgeon: Johnathan Hausen, MD;  Location: [facility];  Service: General;  Laterality: Right;  39 MINUTES ROOM 1   TUBAL LIGATION      Family History  Problem Relation Age of Onset   Peripheral vascular disease Father    Anuerysm Father    Diabetes Mother    Hypertension Mother    Breast cancer Mother     Social History   Socioeconomic History   Marital status: Widowed    Spouse name: Not on file   Number of children: Not on file   Years of education: Not on file   Highest education  level: Not on file  Occupational History   Not on file  Tobacco Use   Smoking status: Never   Smokeless tobacco: Never  Vaping Use   Vaping Use: Never used  Substance and Sexual Activity   Alcohol use: Yes    Comment: rarely   Drug use: Never   Sexual activity: Yes  Other Topics Concern   Not on file  Social History Narrative   Not on file   Social Determinants of Health   Financial Resource Strain: Not on file  Food Insecurity: Not on file  Transportation Needs: Not on file  Physical Activity: Not on file  Stress: Not on file  Social Connections: Not on file  Intimate Partner Violence: Not on file    ROS Pertinent positives and negatives in the history of present illness.      Objective    BP 122/76 (BP Location: Left Arm, Patient Position: Sitting, Cuff Size: Large)   Pulse 64   Temp (!) 97.5 F (36.4 C) (Temporal)   Ht '5\' 6"'$  (1.676 m)   Wt 188 lb (85.3 kg)   SpO2 99%   BMI 30.34 kg/m   Physical Exam Constitutional:      General: She is not in acute distress.    Appearance: Normal appearance. She is not ill-appearing.  HENT:     Mouth/Throat:     Mouth: Mucous membranes are moist.  Eyes:     Conjunctiva/sclera: Conjunctivae normal.     Pupils: Pupils are equal, round, and reactive to light.  Cardiovascular:     Rate and Rhythm: Normal rate and regular rhythm.  Pulmonary:     Effort: Pulmonary effort is normal.     Breath sounds: Normal breath sounds.  Musculoskeletal:     Cervical back: Normal range of motion and neck supple.     Right lower leg: No edema.     Left lower leg: No edema.  Skin:    General: Skin is warm and dry.  Neurological:     General: No focal deficit present.     Mental Status: She is alert and oriented to person, place, and time.     Motor: No weakness.     Gait: Gait normal.  Psychiatric:        Mood and Affect: Mood normal.        Behavior: Behavior normal.        Thought Content: Thought content normal.         Assessment & Plan:   Problem List Items Addressed This Visit       Cardiovascular and Mediastinum   Essential hypertension    BP well controlled. Continue taking chlorthalidone 15  mg daily, amlodipine- benazepril 10-40 mg capsule daily, metoprolol tartrate 25 mg daily.        Relevant Medications   atorvastatin (LIPITOR) 20 MG tablet     Digestive   GERD    Refill Protonix for prn use. Avoid triggers.        Relevant Medications   pantoprazole (PROTONIX) 40 MG tablet     Endocrine   Hyperlipidemia associated with type 2 diabetes mellitus ([place])    She has been out of statin x 1 wk. Refill mediation and follow up pending lipid panel.        Relevant Medications   atorvastatin (LIPITOR) 20 MG tablet   Other Relevant Orders   Lipid panel (Completed)   Type 2 diabetes mellitus without complication, with long-term current use of insulin (HCC) - Primary    Hgb A1c 5.9% in 07/2021. Reviewed patient's EMR including Care everywhere. Diet controlled. Follow up pending lab results. Continue on benazepril, statin and getting annual eye exams.        Relevant Medications   atorvastatin (LIPITOR) 20 MG tablet   Other Relevant Orders   CBC with Differential/Platelet (Completed)   Comprehensive metabolic panel (Completed)   TSH (Completed)   T4, free (Completed)   Hemoglobin A1c (Completed)   Microalbumin / creatinine urine ratio (Completed)     Other   Diuretic-induced hypokalemia    K-dur prescribed for potassium replacement due to diuretic. Follow up in 2 weeks and recheck potassium level.         Relevant Medications   potassium chloride (KLOR-CON M) 10 MEQ tablet   Left knee pain    Known osteoarthritis. Has been under the care of [facility] and requests to be referred to [facility].        Relevant Orders   Ambulatory referral to Orthopedic Surgery   Vitamin D deficiency    Continue vitamin D supplement.        Relevant Orders   VITAMIN D 25 Hydroxy  (Vit-D Deficiency, Fractures) (Completed)    Return for pending labs.   Harland Dingwall, NP-C

## 2021-12-21 NOTE — Assessment & Plan Note (Signed)
Continue vitamin D supplement

## 2021-12-21 NOTE — Assessment & Plan Note (Signed)
Hgb A1c 5.9% in 07/2021. Reviewed patient's EMR including Care everywhere. Diet controlled. Follow up pending lab results. Continue on benazepril, statin and getting annual eye exams.

## 2021-12-21 NOTE — Assessment & Plan Note (Signed)
Known osteoarthritis. Has been under the care of [facility] and requests to be referred to [facility].

## 2021-12-21 NOTE — Progress Notes (Signed)
Please call and schedule her to come in to follow up in 2-3 weeks due to low potassium. I sent in a potassium supplement for her to take once daily for the next 2 weeks and then stop.  Her Hgb A1c is 6.2% and still in prediabetes range.

## 2021-12-21 NOTE — Assessment & Plan Note (Addendum)
K-dur prescribed for potassium replacement due to diuretic. Follow up in 2 weeks and recheck potassium level.

## 2021-12-21 NOTE — Assessment & Plan Note (Signed)
BP well controlled. Continue taking chlorthalidone 15 mg daily, amlodipine- benazepril 10-40 mg capsule daily, metoprolol tartrate 25 mg daily.

## 2021-12-21 NOTE — Assessment & Plan Note (Signed)
Refill Protonix for prn use. Avoid triggers.

## 2021-12-21 NOTE — Patient Instructions (Signed)
Obgyn Offices:   [facility] [address] [place],  [address] [phone]  [facility] Address: [address] [place], [place] [address] Phone: [phone]  [facility] [address] [place], [place] [address] Phone: [phone]   [facility] [address] [place], [place] [address] Phone: [phone] 

## 2021-12-23 ENCOUNTER — Encounter: Payer: Self-pay | Admitting: Family Medicine

## 2022-01-06 ENCOUNTER — Ambulatory Visit: Admitting: Orthopedic Surgery

## 2022-01-17 ENCOUNTER — Ambulatory Visit: Admitting: Orthopedic Surgery

## 2022-01-19 ENCOUNTER — Other Ambulatory Visit: Payer: Self-pay | Admitting: Family Medicine

## 2022-01-19 ENCOUNTER — Encounter: Payer: Self-pay | Admitting: Family Medicine

## 2022-01-19 ENCOUNTER — Ambulatory Visit (INDEPENDENT_AMBULATORY_CARE_PROVIDER_SITE_OTHER): Payer: BC Managed Care – PPO | Admitting: Family Medicine

## 2022-01-19 VITALS — BP 106/68 | HR 60 | Temp 97.7°F | Ht 66.0 in | Wt 193.0 lb

## 2022-01-19 DIAGNOSIS — E119 Type 2 diabetes mellitus without complications: Secondary | ICD-10-CM

## 2022-01-19 DIAGNOSIS — T502X5A Adverse effect of carbonic-anhydrase inhibitors, benzothiadiazides and other diuretics, initial encounter: Secondary | ICD-10-CM

## 2022-01-19 DIAGNOSIS — E876 Hypokalemia: Secondary | ICD-10-CM | POA: Diagnosis not present

## 2022-01-19 DIAGNOSIS — R252 Cramp and spasm: Secondary | ICD-10-CM | POA: Diagnosis not present

## 2022-01-19 DIAGNOSIS — Z794 Long term (current) use of insulin: Secondary | ICD-10-CM | POA: Diagnosis not present

## 2022-01-19 DIAGNOSIS — I10 Essential (primary) hypertension: Secondary | ICD-10-CM | POA: Diagnosis not present

## 2022-01-19 LAB — BASIC METABOLIC PANEL
BUN: 15 mg/dL (ref 6–23)
CO2: 33 mEq/L — ABNORMAL HIGH (ref 19–32)
Calcium: 10 mg/dL (ref 8.4–10.5)
Chloride: 103 mEq/L (ref 96–112)
Creatinine, Ser: 1.08 mg/dL (ref 0.40–1.20)
GFR: 56.48 mL/min — ABNORMAL LOW (ref >60.00)
Glucose, Bld: 120 mg/dL — ABNORMAL HIGH (ref 70–99)
Potassium: 4.3 mEq/L (ref 3.5–5.1)
Sodium: 135 mEq/L (ref 135–145)

## 2022-01-19 LAB — MAGNESIUM: Magnesium: 2.3 mg/dL (ref 1.5–2.5)

## 2022-01-19 MED ORDER — POTASSIUM CHLORIDE CRYS ER 10 MEQ PO TBCR: 10.0000 meq | EXTENDED_RELEASE_TABLET | Freq: Every day | ORAL | 1 refills | Status: DC

## 2022-01-19 NOTE — Assessment & Plan Note (Signed)
A1c 6.2%. diet controlled. Continue healthy diet and exercise.

## 2022-01-19 NOTE — Assessment & Plan Note (Addendum)
Left lower anterior leg cramping. May be due to left knee pain. Potassium now in normal range. Magnesium normal. She will continue to monitor and follow up if not improving.

## 2022-01-19 NOTE — Assessment & Plan Note (Signed)
BP well controlled. If her readings are consistently low normal, consider stopping chlorthalidone and keeping her on metoprolol 25 mg, amlodipine-benazepril 10-40 mg daily.

## 2022-01-19 NOTE — Patient Instructions (Signed)
Go downstairs to the lab before you leave today.  I recommend buying a blood pressure machine at your local pharmacy and start checking your blood pressures at home.  Goal blood pressure is less than 130/80.  If you have any symptoms such as lightheadedness, dizziness, fatigue then let me know.  We may consider reducing your blood pressure medication if that occurs.  I recommend starting on a women's One-A-Day multivitamin.  I will be in touch with your lab results.

## 2022-01-19 NOTE — Assessment & Plan Note (Signed)
Potassium in goal range now. Refill K-dur 10 mEq daily for now. Consider stopping diuretic if BP is soft. She feels good for now.

## 2022-01-19 NOTE — Progress Notes (Signed)
Subjective:     Patient ID: Gina Padilla, female    DOB: 1961/02/04, 61 y.o.   MRN: [id number]  Chief Complaint  Patient presents with   Follow-up    F/u to recheck potassium. Has been taking the supplement prescribed    HPI Patient is in today for follow up on hypokalemia.  She is taking chlorthalidone 50 mg daily for hypertension.  She has been taking K-Dur 10 mEq for the past 30 days. She does not check her blood pressure at home.  No fever, chills, dizziness, headache, chest pain, palpitations, shortness of breath, abdominal pain, nausea, vomiting, diarrhea, urinary symptoms, edema. No muscle weakness.   States her left lower anterior leg often bothers her at night, feels like cramping.  No calf pain.  Diabetes with hemoglobin A1c 6.2%.  Diet controlled. She is taking Lipitor as prescribed.  Health Maintenance Due  Topic Date Due   FOOT EXAM  Never done   HIV Screening  Never done   Hepatitis C Screening  Never done    Past Medical History:  Diagnosis Date   Anemia    Arthritis    GERD (gastroesophageal reflux disease)    Hypertension     Past Surgical History:  Procedure Laterality Date   ABDOMINAL HYSTERECTOMY     CESAREAN SECTION     FOOT SURGERY     RADIOACTIVE SEED GUIDED EXCISIONAL BREAST BIOPSY Right 11/10/2020   Procedure: RADIOACTIVE SEED GUIDED EXCISIONAL RIGHT BREAST BIOPSY;  Surgeon: Johnathan Hausen, MD;  Location: [place];  Service: General;  Laterality: Right;  66 MINUTES ROOM 1   TUBAL LIGATION      Family History  Problem Relation Age of Onset   Peripheral vascular disease Father    Anuerysm Father    Diabetes Mother    Hypertension Mother    Breast cancer Mother     Social History   Socioeconomic History   Marital status: Widowed    Spouse name: Not on file   Number of children: Not on file   Years of education: Not on file   Highest education level: Not on file  Occupational History   Not on file  Tobacco  Use   Smoking status: Never   Smokeless tobacco: Never  Vaping Use   Vaping Use: Never used  Substance and Sexual Activity   Alcohol use: Yes    Comment: rarely   Drug use: Never   Sexual activity: Yes  Other Topics Concern   Not on file  Social History Narrative   Not on file   Social Determinants of Health   Financial Resource Strain: Not on file  Food Insecurity: Not on file  Transportation Needs: Not on file  Physical Activity: Not on file  Stress: Not on file  Social Connections: Not on file  Intimate Partner Violence: Not on file    Outpatient Medications Prior to Visit  Medication Sig Dispense Refill   amLODipine-benazepril (LOTREL) 10-40 MG capsule TAKE 1 CAPSULE BY MOUTH  DAILY 90 capsule 1   aspirin 81 MG tablet Take 81 mg by mouth daily.     atorvastatin (LIPITOR) 20 MG tablet Take 1 tablet (20 mg total) by mouth daily. 90 tablet 1   chlorthalidone (HYGROTEN) 15 MG tablet Take 15 mg by mouth daily.     cholecalciferol (VITAMIN D) 1000 units tablet Take 1,000 Units by mouth daily.     metoprolol succinate (TOPROL-XL) 50 MG 24 hr tablet Take 50 mg by  mouth daily.     Multiple Vitamin (MULTIVITAMIN WITH MINERALS) TABS tablet Take 1 tablet by mouth daily.     pantoprazole (PROTONIX) 40 MG tablet Take 1 tablet (40 mg total) by mouth daily. 30 tablet 2   potassium chloride (KLOR-CON M) 10 MEQ tablet Take 1 tablet (10 mEq total) by mouth daily. 30 tablet 0   metoprolol tartrate (LOPRESSOR) 25 MG tablet Take 25 mg by mouth 2 (two) times daily.     No facility-administered medications prior to visit.    Allergies  Allergen Reactions   Codeine Phosphate    Hydrochlorothiazide     SOB, flutter in chest, difficult to inhale   Morphine Sulfate     ROS Pertinent positives and negatives in the history of present illness.     Objective:    Physical Exam Constitutional:      General: She is not in acute distress.    Appearance: She is not ill-appearing.   Cardiovascular:     Rate and Rhythm: Normal rate and regular rhythm.     Pulses: Normal pulses.  Pulmonary:     Effort: Pulmonary effort is normal.     Breath sounds: Normal breath sounds.  Musculoskeletal:        General: Normal range of motion.     Right lower leg: No edema.     Left lower leg: No edema.  Skin:    General: Skin is warm and dry.  Neurological:     General: No focal deficit present.     Mental Status: She is alert and oriented to person, place, and time.     Motor: No weakness.  Psychiatric:        Mood and Affect: Mood normal.        Thought Content: Thought content normal.     BP 106/68 (BP Location: Left Arm, Patient Position: Sitting, Cuff Size: Large)   Pulse 60   Temp 97.7 F (36.5 C) (Temporal)   Ht '5\' 6"'$  (1.676 m)   Wt 193 lb (87.5 kg)   SpO2 97%   BMI 31.15 kg/m  Wt Readings from Last 3 Encounters:  01/19/22 193 lb (87.5 kg)  12/21/21 188 lb (85.3 kg)  11/10/20 191 lb 9.3 oz (86.9 kg)       Assessment & Plan:   Problem List Items Addressed This Visit       Cardiovascular and Mediastinum   Essential hypertension    BP well controlled. If her readings are consistently low normal, consider stopping chlorthalidone and keeping her on metoprolol 25 mg, amlodipine-benazepril 10-40 mg daily.       Relevant Medications   metoprolol succinate (TOPROL-XL) 50 MG 24 hr tablet     Endocrine   Type 2 diabetes mellitus without complication, with long-term current use of insulin (HCC)    A1c 6.2%. diet controlled. Continue healthy diet and exercise.       Relevant Orders   Basic metabolic panel (Completed)     Other   Diuretic-induced hypokalemia - Primary    Potassium in goal range now. Refill K-dur 10 mEq daily for now. Consider stopping diuretic if BP is soft. She feels good for now.       Relevant Orders   Basic metabolic panel (Completed)   Muscle cramps at night    Left lower anterior leg cramping. May be due to left knee pain.  Potassium now in normal range. Magnesium normal. She will continue to monitor and follow up if not improving.  Relevant Orders   Magnesium (Completed)    I have discontinued Loys Hoselton. Ashworth's metoprolol tartrate. I am also having her maintain her cholecalciferol, aspirin, multivitamin with minerals, amLODipine-benazepril, chlorthalidone, pantoprazole, atorvastatin, potassium chloride, and metoprolol succinate.  No orders of the defined types were placed in this encounter.

## 2022-01-19 NOTE — Progress Notes (Signed)
Please schedule her for a 2 month follow up. I refilled her potassium supplements. Keep an eye on BP and bring in readings to follow up visit.

## 2022-02-08 ENCOUNTER — Encounter (HOSPITAL_COMMUNITY): Payer: Self-pay

## 2022-03-23 ENCOUNTER — Encounter: Payer: Self-pay | Admitting: Family Medicine

## 2022-03-23 ENCOUNTER — Ambulatory Visit (INDEPENDENT_AMBULATORY_CARE_PROVIDER_SITE_OTHER): Payer: BC Managed Care – PPO | Admitting: Family Medicine

## 2022-03-23 VITALS — BP 130/80 | HR 80 | Temp 97.6°F | Ht 66.0 in | Wt 191.0 lb

## 2022-03-23 DIAGNOSIS — I10 Essential (primary) hypertension: Secondary | ICD-10-CM

## 2022-03-23 DIAGNOSIS — E876 Hypokalemia: Secondary | ICD-10-CM | POA: Diagnosis not present

## 2022-03-23 DIAGNOSIS — T502X5A Adverse effect of carbonic-anhydrase inhibitors, benzothiadiazides and other diuretics, initial encounter: Secondary | ICD-10-CM | POA: Diagnosis not present

## 2022-03-23 DIAGNOSIS — Z23 Encounter for immunization: Secondary | ICD-10-CM | POA: Diagnosis not present

## 2022-03-23 LAB — BASIC METABOLIC PANEL
BUN: 20 mg/dL (ref 6–23)
CO2: 32 mEq/L (ref 19–32)
Calcium: 9.7 mg/dL (ref 8.4–10.5)
Chloride: 100 mEq/L (ref 96–112)
Creatinine, Ser: 1.09 mg/dL (ref 0.40–1.20)
GFR: 55.79 mL/min — ABNORMAL LOW (ref >60.00)
Glucose, Bld: 134 mg/dL — ABNORMAL HIGH (ref 70–99)
Potassium: 3.8 mEq/L (ref 3.5–5.1)
Sodium: 139 mEq/L (ref 135–145)

## 2022-03-23 NOTE — Patient Instructions (Signed)
Obgyn Offices:   [facility] [address] [place], [place] [address] [phone]  [facility] Address: [address] [place], [place] [address] Phone: [phone]  [facility] [address] [place], [place] [address] Phone: [phone]   [facility] [address] [place], [place] [address] Phone: [phone] 

## 2022-03-23 NOTE — Progress Notes (Signed)
Subjective:     Patient ID: Gina Padilla, female    DOB: July 07, 1961, 61 y.o.   MRN: [id number]  Chief Complaint  Patient presents with   Follow-up    2 month f/u and recheck labs. Has been checking BP.    HPI Patient is in today for follow up on BP and potassium.  Taking potassium supplement daily.   BP at home has been in the 130s/70s-80s.   Reports good daily compliance with chlorthalidone 15 mg daily, amlodipine-benazepril 10-40 mg daily and Toprol-XL 50 mg daily.  Does not drink soda.  4 bottles of water per day.  1 cup of black coffee daily.   Feeling well.   Denies fever, chills, dizziness, chest pain, palpitations, shortness of breath, abdominal pain, N/V/D, urinary symptoms, LE edema.      Health Maintenance Due  Topic Date Due   FOOT EXAM  Never done   HIV Screening  Never done   Hepatitis C Screening  Never done    Past Medical History:  Diagnosis Date   Anemia    Arthritis    GERD (gastroesophageal reflux disease)    Hypertension     Past Surgical History:  Procedure Laterality Date   ABDOMINAL HYSTERECTOMY     CESAREAN SECTION     FOOT SURGERY     RADIOACTIVE SEED GUIDED EXCISIONAL BREAST BIOPSY Right 11/10/2020   Procedure: RADIOACTIVE SEED GUIDED EXCISIONAL RIGHT BREAST BIOPSY;  Surgeon: Johnathan Hausen, MD;  Location: [place];  Service: General;  Laterality: Right;  29 MINUTES ROOM 1   TUBAL LIGATION      Family History  Problem Relation Age of Onset   Peripheral vascular disease Father    Anuerysm Father    Diabetes Mother    Hypertension Mother    Breast cancer Mother     Social History   Socioeconomic History   Marital status: Widowed    Spouse name: Not on file   Number of children: Not on file   Years of education: Not on file   Highest education level: Not on file  Occupational History   Not on file  Tobacco Use   Smoking status: Never   Smokeless tobacco: Never  Vaping Use   Vaping Use: Never  used  Substance and Sexual Activity   Alcohol use: Yes    Comment: rarely   Drug use: Never   Sexual activity: Yes  Other Topics Concern   Not on file  Social History Narrative   Not on file   Social Determinants of Health   Financial Resource Strain: Not on file  Food Insecurity: Not on file  Transportation Needs: Not on file  Physical Activity: Not on file  Stress: Not on file  Social Connections: Not on file  Intimate Partner Violence: Not on file    Outpatient Medications Prior to Visit  Medication Sig Dispense Refill   amLODipine-benazepril (LOTREL) 10-40 MG capsule TAKE 1 CAPSULE BY MOUTH  DAILY 90 capsule 1   aspirin 81 MG tablet Take 81 mg by mouth daily.     atorvastatin (LIPITOR) 20 MG tablet Take 1 tablet (20 mg total) by mouth daily. 90 tablet 1   chlorthalidone (HYGROTEN) 15 MG tablet Take 15 mg by mouth daily.     cholecalciferol (VITAMIN D) 1000 units tablet Take 1,000 Units by mouth daily.     metoprolol succinate (TOPROL-XL) 50 MG 24 hr tablet Take 50 mg by mouth daily.     Multiple  Vitamin (MULTIVITAMIN WITH MINERALS) TABS tablet Take 1 tablet by mouth daily.     pantoprazole (PROTONIX) 40 MG tablet Take 1 tablet (40 mg total) by mouth daily. 30 tablet 2   potassium chloride (KLOR-CON M) 10 MEQ tablet Take 1 tablet (10 mEq total) by mouth daily. 30 tablet 1   No facility-administered medications prior to visit.    Allergies  Allergen Reactions   Codeine Phosphate    Hydrochlorothiazide     SOB, flutter in chest, difficult to inhale   Morphine Sulfate     ROS     Objective:    Physical Exam  BP 130/80 (BP Location: Left Arm, Patient Position: Sitting, Cuff Size: Large)   Pulse 80   Temp 97.6 F (36.4 C) (Temporal)   Ht '5\' 6"'$  (1.676 m)   Wt 191 lb (86.6 kg)   SpO2 98%   BMI 30.83 kg/m  Wt Readings from Last 3 Encounters:  03/23/22 191 lb (86.6 kg)  01/19/22 193 lb (87.5 kg)  12/21/21 188 lb (85.3 kg)   Alert and oriented and in no  acute distress.  Respirations unlabored.  No LE edema.  Normal speech, mood and memory.    Assessment & Plan:   Problem List Items Addressed This Visit       Cardiovascular and Mediastinum   Essential hypertension - Primary    Blood pressure fairly well controlled at home.  She will continue current medication regimen and continue monitoring blood pressure.  BMP ordered. follow-up in 3 months      Relevant Orders   Basic metabolic panel     Other   Diuretic-induced hypokalemia    Blood pressure controlled with current medication regimen including chlorthalidone 15 mg daily.  She will continue K-Dur 10 mill equivalents.  Check BMP and follow-up      Relevant Orders   Basic metabolic panel   Other Visit Diagnoses     Need for influenza vaccination       Relevant Orders   Flu Vaccine QUAD 43moIM (Fluarix, Fluzone & Alfiuria Quad PF) (Completed)       I am having SDomenic Schwabmaintain her cholecalciferol, aspirin, multivitamin with minerals, amLODipine-benazepril, chlorthalidone, pantoprazole, atorvastatin, metoprolol succinate, and potassium chloride.  No orders of the defined types were placed in this encounter.

## 2022-03-23 NOTE — Assessment & Plan Note (Signed)
Blood pressure controlled with current medication regimen including chlorthalidone 15 mg daily.  She will continue K-Dur 10 mill equivalents.  Check BMP and follow-up

## 2022-03-23 NOTE — Assessment & Plan Note (Signed)
Blood pressure fairly well controlled at home.  She will continue current medication regimen and continue monitoring blood pressure.  BMP ordered. follow-up in 3 months

## 2022-03-29 ENCOUNTER — Other Ambulatory Visit: Payer: Self-pay | Admitting: Family Medicine

## 2022-03-29 DIAGNOSIS — E876 Hypokalemia: Secondary | ICD-10-CM

## 2022-04-28 ENCOUNTER — Telehealth: Payer: Self-pay

## 2022-04-28 DIAGNOSIS — I10 Essential (primary) hypertension: Secondary | ICD-10-CM

## 2022-04-28 MED ORDER — METOPROLOL SUCCINATE ER 50 MG PO TB24: 50.0000 mg | ORAL_TABLET | Freq: Every day | ORAL | 0 refills | Status: DC

## 2022-04-28 NOTE — Telephone Encounter (Signed)
Rx sent 

## 2022-04-28 NOTE — Telephone Encounter (Signed)
This will be her first fill with you.. ok to refill?  

## 2022-04-28 NOTE — Telephone Encounter (Signed)
MEDICATION: metoprolol succinate (TOPROL-XL) 50 MG 24 hr tablet  PHARMACY: [facility] [phone] - [place], [place] - [place]  Comments: Patient states [facility] got a denial from [place] but patient is completely out .   **Let patient know to contact pharmacy at the end of the day to make sure medication is ready. **  ** Please notify patient to allow 48-72 hours to process**  **Encourage patient to contact the pharmacy for refills or they can request refills through [facility]**

## 2022-05-02 ENCOUNTER — Other Ambulatory Visit: Payer: Self-pay | Admitting: Family Medicine

## 2022-05-02 DIAGNOSIS — K219 Gastro-esophageal reflux disease without esophagitis: Secondary | ICD-10-CM

## 2022-05-12 ENCOUNTER — Telehealth: Payer: Self-pay

## 2022-05-12 NOTE — Telephone Encounter (Signed)
Called pt and she would like letter sent to her mychart. Mychart message was sent with flu vaccine info.

## 2022-05-12 NOTE — Telephone Encounter (Signed)
Patient is asking for a letter stating she received her flu shot on 03/23/22. Would like a call once completed.

## 2022-06-20 ENCOUNTER — Other Ambulatory Visit: Payer: Self-pay | Admitting: Family Medicine

## 2022-06-20 DIAGNOSIS — E1169 Type 2 diabetes mellitus with other specified complication: Secondary | ICD-10-CM

## 2022-06-28 ENCOUNTER — Ambulatory Visit (INDEPENDENT_AMBULATORY_CARE_PROVIDER_SITE_OTHER): Payer: BC Managed Care – PPO | Admitting: Family Medicine

## 2022-06-28 ENCOUNTER — Encounter: Payer: Self-pay | Admitting: Family Medicine

## 2022-06-28 VITALS — BP 124/84 | HR 73 | Temp 97.6°F | Ht 66.0 in | Wt 194.0 lb

## 2022-06-28 DIAGNOSIS — Z794 Long term (current) use of insulin: Secondary | ICD-10-CM | POA: Diagnosis not present

## 2022-06-28 DIAGNOSIS — I10 Essential (primary) hypertension: Secondary | ICD-10-CM

## 2022-06-28 DIAGNOSIS — E785 Hyperlipidemia, unspecified: Secondary | ICD-10-CM

## 2022-06-28 DIAGNOSIS — E119 Type 2 diabetes mellitus without complications: Secondary | ICD-10-CM

## 2022-06-28 DIAGNOSIS — Z114 Encounter for screening for human immunodeficiency virus [HIV]: Secondary | ICD-10-CM | POA: Diagnosis not present

## 2022-06-28 DIAGNOSIS — Z0001 Encounter for general adult medical examination with abnormal findings: Secondary | ICD-10-CM

## 2022-06-28 DIAGNOSIS — E1169 Type 2 diabetes mellitus with other specified complication: Secondary | ICD-10-CM

## 2022-06-28 DIAGNOSIS — Z1159 Encounter for screening for other viral diseases: Secondary | ICD-10-CM

## 2022-06-28 LAB — CBC WITH DIFFERENTIAL/PLATELET
Basophils Absolute: 0 10*3/uL (ref 0.0–0.1)
Basophils Relative: 0.5 % (ref 0.0–3.0)
Eosinophils Absolute: 0.3 10*3/uL (ref 0.0–0.7)
Eosinophils Relative: 3.7 % (ref 0.0–5.0)
HCT: 39.4 % (ref 36.0–46.0)
Hemoglobin: 13.5 g/dL (ref 12.0–15.0)
Lymphocytes Relative: 37.1 % (ref 12.0–46.0)
Lymphs Abs: 2.5 10*3/uL (ref 0.7–4.0)
MCHC: 34.2 g/dL (ref 30.0–36.0)
MCV: 89.8 fl (ref 78.0–100.0)
Monocytes Absolute: 0.5 10*3/uL (ref 0.1–1.0)
Monocytes Relative: 7.7 % (ref 3.0–12.0)
Neutro Abs: 3.5 10*3/uL (ref 1.4–7.7)
Neutrophils Relative %: 51 % (ref 43.0–77.0)
Platelets: 293 10*3/uL (ref 150.0–400.0)
RBC: 4.39 Mil/uL (ref 3.87–5.11)
RDW: 12.8 % (ref 11.5–15.5)
WBC: 6.8 10*3/uL (ref 4.0–10.5)

## 2022-06-28 LAB — COMPREHENSIVE METABOLIC PANEL
ALT: 19 U/L (ref 0–35)
AST: 20 U/L (ref 0–37)
Albumin: 4.5 g/dL (ref 3.5–5.2)
Alkaline Phosphatase: 57 U/L (ref 39–117)
BUN: 20 mg/dL (ref 6–23)
CO2: 32 mEq/L (ref 19–32)
Calcium: 9.5 mg/dL (ref 8.4–10.5)
Chloride: 100 mEq/L (ref 96–112)
Creatinine, Ser: 1.09 mg/dL (ref 0.40–1.20)
GFR: 55.69 mL/min — ABNORMAL LOW (ref >60.00)
Glucose, Bld: 93 mg/dL (ref 70–99)
Potassium: 3.7 mEq/L (ref 3.5–5.1)
Sodium: 139 mEq/L (ref 135–145)
Total Bilirubin: 0.3 mg/dL (ref 0.2–1.2)
Total Protein: 7.8 g/dL (ref 6.0–8.3)

## 2022-06-28 LAB — URINALYSIS, ROUTINE W REFLEX MICROSCOPIC
Bilirubin Urine: NEGATIVE
Hgb urine dipstick: NEGATIVE
Ketones, ur: NEGATIVE
Leukocytes,Ua: NEGATIVE
Nitrite: NEGATIVE
Specific Gravity, Urine: 1.015 (ref 1.000–1.030)
Total Protein, Urine: NEGATIVE
Urine Glucose: NEGATIVE
Urobilinogen, UA: 0.2 (ref 0.0–1.0)
pH: 6.5 (ref 5.0–8.0)

## 2022-06-28 LAB — TSH: TSH: 1.12 u[IU]/mL (ref 0.35–5.50)

## 2022-06-28 LAB — HEMOGLOBIN A1C: Hgb A1c MFr Bld: 6.4 % (ref 4.6–6.5)

## 2022-06-28 NOTE — Progress Notes (Signed)
Subjective:    Patient ID: Gina Padilla, female    DOB: 03-14-1961, 61 y.o.   MRN: [id number]  HPI Chief Complaint  Patient presents with   Annual Exam   She is here for a complete physical exam and to follow up on HTN and DM.   HTN- reports taking amlodipine-benazepril 10-40 mg daily, Toprol XL 50 mg daily and chlorthalidone 15 mg daily. She has had low potassium as a result of diuretic. Taking potassium 10 MEQ daily.   Social history: widow, married again, 3 kids, works as a Geophysicist/field seismologist.  Denis smoking, drinking alcohol, drug use  Diet: high protein  Excerise: nothing regular   Immunizations: UTD  Health maintenance:   Mammogram: 08/2021 Colonoscopy: due next year. Dr. Collene Mares Last Gynecological Exam: overdue   Wears seatbelt always, smoke detectors in home and functioning, does not text while driving and feels safe in home environment.   Reviewed allergies, medications, past medical, surgical, family, and social history.    Review of Systems Review of Systems Constitutional: -fever, -chills, -sweats, -unexpected weight change,-fatigue ENT: -runny nose, -ear pain, -sore throat Cardiology:  -chest pain, -palpitations, -edema Respiratory: -cough, -shortness of breath, -wheezing Gastroenterology: -abdominal pain, -nausea, -vomiting, -diarrhea, -constipation Hematology: -bleeding or bruising problems Musculoskeletal: -arthralgias, -myalgias, -joint swelling, -back pain Ophthalmology: -vision changes Urology: -dysuria, -difficulty urinating, -hematuria, -urinary frequency, -urgency Neurology: -headache, -weakness, -tingling, -numbness       Objective:   Physical Exam BP 124/84 (BP Location: Left Arm, Patient Position: Sitting, Cuff Size: Large)   Pulse 73   Temp 97.6 F (36.4 C) (Temporal)   Ht '5\' 6"'$  (1.676 m)   Wt 194 lb (88 kg)   SpO2 97%   BMI 31.31 kg/m   General Appearance:    Alert, cooperative, no distress, appears stated age  Head:    Normocephalic, without  obvious abnormality, atraumatic  Eyes:    PERRL, conjunctiva/corneas clear, EOM's intact  Ears:    Normal TM's and external ear canals  Nose:   Nares normal, mucosa normal, no drainage   Throat:   Lips, mucosa, and tongue normal  Neck:   Supple, no lymphadenopathy;  thyroid:  no   enlargement/tenderness/nodules; no JVD  Back:    Spine nontender, no curvature, ROM normal, no CVA     tenderness  Lungs:     Clear to auscultation bilaterally without wheezes, rales or     ronchi; respirations unlabored  Chest Wall:    No tenderness or deformity   Heart:    Regular rate and rhythm  Breast Exam:    No tenderness, masses, or nipple discharge or inversion.      No axillary lymphadenopathy  Abdomen:     Soft, non-tender, nondistended, normoactive bowel sounds,    no masses, no hepatosplenomegaly  Genitalia:    OB/GYN     Extremities:   No clubbing, cyanosis or edema  Pulses:   2+ and symmetric all extremities  Skin:   Skin color, texture, turgor normal, no rashes or lesions  Lymph nodes:   Cervical, supraclavicular, and axillary nodes normal  Neurologic:   CNII-XII intact, normal strength, sensation and gait          Psych:   Normal mood, affect, hygiene and grooming.          Assessment & Plan:  Encounter for general adult medical examination with abnormal findings - Plan: Urinalysis, Routine w reflex microscopic, Urinalysis, Routine w reflex microscopic -Preventive health care reviewed.  Recommend she  call and schedule with an OB/GYN.  Mammogram and colonoscopy up-to-date.  Counseling on healthy lifestyle including diet and exercise.  Immunizations reviewed.  Essential hypertension - Plan: CBC with Differential/Platelet, Comprehensive metabolic panel, Comprehensive metabolic panel, CBC with Differential/Platelet -Fairly well-controlled.  Continue current medication regimen.  We did discuss switching chlorthalidone to triamterene-HCTZ.  Due to potassium loss having to take a potassium  supplement.  Check labs and follow-up  Type 2 diabetes mellitus without complication, with long-term current use of insulin ([place]) - Plan: Hemoglobin A1c, TSH, TSH, Hemoglobin A1c -Diet controlled.  Check hemoglobin A1c and thyroid function and follow-up  Hyperlipidemia associated with type 2 diabetes mellitus ([place]) -Reviewed most recent lipid panel which was close to goal.  Continue atorvastatin 20 mg daily.  Check fasting lipids at her 36-monthfollow-up  Need for hepatitis C screening test - Plan: Hepatitis C antibody, Hepatitis C antibody  Screening for HIV without presence of risk factors - Plan: HIV Antibody (routine testing w rflx), HIV Antibody (routine testing w rflx)

## 2022-06-28 NOTE — Patient Instructions (Signed)
Obgyn Offices:   [facility] [address] [place], [place] [phone]  [facility] Address: [address] [place], [place] [address] Phone: [phone]  [facility] [address] [place], [place] [address] Phone: [phone]  [place],  [address] Phone: [phone]  Preventive Care 28-59 Years Old, Female Preventive care refers to lifestyle choices and visits with your health care provider that can promote health and wellness. Preventive care visits are also called wellness exams. What can I expect for my preventive care visit? Counseling Your health care provider may ask you questions about your: Medical history, including: Past medical problems. Family medical history. Pregnancy history. Current health, including: Menstrual cycle. Method of birth control. Emotional well-being. Home life and relationship well-being. Sexual activity and sexual health. Lifestyle, including: Alcohol, nicotine or tobacco, and drug use. Access to firearms. Diet, exercise, and sleep habits. Work and work Statistician. Sunscreen use. Safety issues such as seatbelt and bike helmet use. Physical exam Your health care provider will check your: Height and weight. These may be used to calculate your BMI (body mass index). BMI is a measurement that tells if you are at a healthy weight. Waist circumference. This measures the distance around your waistline. This measurement also tells if you are at a healthy weight and may help predict your risk of certain diseases, such as type 2 diabetes and high blood pressure. Heart rate and blood pressure. Body temperature. Skin for abnormal spots. What immunizations do I need?  Vaccines are usually given at various ages, according to a schedule. Your health care provider will recommend vaccines for you based on your  age, medical history, and lifestyle or other factors, such as travel or where you work. What tests do I need? Screening Your health care provider may recommend screening tests for certain conditions. This may include: Lipid and cholesterol levels. Diabetes screening. This is done by checking your blood sugar (glucose) after you have not eaten for a while (fasting). Pelvic exam and Pap test. Hepatitis B test. Hepatitis C test. HIV (human immunodeficiency virus) test. STI (sexually transmitted infection) testing, if you are at risk. Lung cancer screening. Colorectal cancer screening. Mammogram. Talk with your health care provider about when you should start having regular mammograms. This may depend on whether you have a family history of breast cancer. BRCA-related cancer screening. This may be done if you have a family history of breast, ovarian, tubal, or peritoneal cancers. Bone density scan. This is done to screen for osteoporosis. Talk with your health care provider about your test results, treatment options, and if necessary, the need for more tests. Follow these instructions at home: Eating and drinking  Eat a diet that includes fresh fruits and vegetables, whole grains, lean protein, and low-fat dairy products. Take vitamin and mineral supplements as recommended by your health care provider. Do not drink alcohol if: Your health care provider tells you not to drink. You are pregnant, may be pregnant, or are planning to become pregnant. If you drink alcohol: Limit how much you have to 0-1 drink a day. Know how much alcohol is in your drink. In the [place] one drink equals one 12 oz bottle of beer (355 mL), one 5 oz glass of wine (148 mL), or one 1 oz glass of hard liquor (44 mL). Lifestyle Brush your teeth every morning and night with fluoride toothpaste. Floss one time each day. Exercise for  at least 30 minutes 5 or more days each week. Do not use any products that contain  nicotine or tobacco. These products include cigarettes, chewing tobacco, and vaping devices, such as e-cigarettes. If you need help quitting, ask your health care provider. Do not use drugs. If you are sexually active, practice safe sex. Use a condom or other form of protection to prevent STIs. If you do not wish to become pregnant, use a form of birth control. If you plan to become pregnant, see your health care provider for a prepregnancy visit. Take aspirin only as told by your health care provider. Make sure that you understand how much to take and what form to take. Work with your health care provider to find out whether it is safe and beneficial for you to take aspirin daily. Find healthy ways to manage stress, such as: Meditation, yoga, or listening to music. Journaling. Talking to a trusted person. Spending time with friends and family. Minimize exposure to UV radiation to reduce your risk of skin cancer. Safety Always wear your seat belt while driving or riding in a vehicle. Do not drive: If you have been drinking alcohol. Do not ride with someone who has been drinking. When you are tired or distracted. While texting. If you have been using any mind-altering substances or drugs. Wear a helmet and other protective equipment during sports activities. If you have firearms in your house, make sure you follow all gun safety procedures. Seek help if you have been physically or sexually abused. What's next? Visit your health care provider once a year for an annual wellness visit. Ask your health care provider how often you should have your eyes and teeth checked. Stay up to date on all vaccines. This information is not intended to replace advice given to you by your health care provider. Make sure you discuss any questions you have with your health care provider. Document Revised: 01/13/2021 Document Reviewed: 01/13/2021 Elsevier Patient Education  [place].

## 2022-06-29 LAB — HEPATITIS C ANTIBODY: Hepatitis C Ab: NONREACTIVE

## 2022-06-29 LAB — HIV ANTIBODY (ROUTINE TESTING W REFLEX): HIV 1&2 Ab, 4th Generation: NONREACTIVE

## 2022-07-04 ENCOUNTER — Other Ambulatory Visit: Payer: Self-pay | Admitting: Family Medicine

## 2022-07-04 DIAGNOSIS — E876 Hypokalemia: Secondary | ICD-10-CM

## 2022-07-11 ENCOUNTER — Other Ambulatory Visit: Payer: Self-pay

## 2022-07-11 ENCOUNTER — Ambulatory Visit (INDEPENDENT_AMBULATORY_CARE_PROVIDER_SITE_OTHER)

## 2022-07-11 ENCOUNTER — Ambulatory Visit (HOSPITAL_COMMUNITY)
Admission: EM | Admit: 2022-07-11 | Discharge: 2022-07-11 | Disposition: A | Discharge status: Home / Self Care | Attending: Family Medicine | Admitting: Family Medicine

## 2022-07-11 ENCOUNTER — Encounter (HOSPITAL_COMMUNITY): Payer: Self-pay | Admitting: Emergency Medicine

## 2022-07-11 DIAGNOSIS — R059 Cough, unspecified: Secondary | ICD-10-CM

## 2022-07-11 DIAGNOSIS — J069 Acute upper respiratory infection, unspecified: Secondary | ICD-10-CM

## 2022-07-11 DIAGNOSIS — R0602 Shortness of breath: Secondary | ICD-10-CM | POA: Diagnosis not present

## 2022-07-11 DIAGNOSIS — J4521 Mild intermittent asthma with (acute) exacerbation: Secondary | ICD-10-CM

## 2022-07-11 DIAGNOSIS — R051 Acute cough: Secondary | ICD-10-CM

## 2022-07-11 MED ORDER — PREDNISONE 20 MG PO TABS: 40.0000 mg | ORAL_TABLET | Freq: Every day | ORAL | 0 refills | Status: AC

## 2022-07-11 MED ORDER — BENZONATATE 100 MG PO CAPS: 100.0000 mg | ORAL_CAPSULE | Freq: Three times a day (TID) | ORAL | 0 refills | Status: DC | PRN

## 2022-07-11 MED ORDER — ALBUTEROL SULFATE HFA 108 (90 BASE) MCG/ACT IN AERS: 2.0000 | INHALATION_SPRAY | RESPIRATORY_TRACT | 0 refills | Status: AC | PRN

## 2022-07-11 NOTE — ED Triage Notes (Signed)
Monday night ( one week ago )started having chills and fever 102.  Covid test was negative-this was performed on Monday.   Wednesday started with cough.  Patient cannot sleep well at night due to cough.    Patient has taken Theraflu, coricidin cold and flu, robitussin .

## 2022-07-11 NOTE — ED Provider Notes (Signed)
[place]    CSN: [id number] Arrival date & time: 07/11/22  1904      History   Chief Complaint Chief Complaint  Patient presents with   Cough    HPI Gina Padilla is a 61 y.o. female.    Cough  Here for cough and shortness of breath. On December 4 she began having fever and cough and rhinorrhea and nasal congestion.  The fever dissipated by the next day, but she began coughing a lot more on December 6.  Then today she has felt tight and wheezy in her chest and short of breath.  She has never been known to have asthma has never used an inhaler.  She has been nauseated all this time but has not had any vomiting or diarrhea.  Last menstrual period was a few years ago when she had a hysterectomy  Past Medical History:  Diagnosis Date   Anemia    Arthritis    GERD (gastroesophageal reflux disease)    Hypertension     Patient Active Problem List   Diagnosis Date Noted   Muscle cramps at night 01/19/2022   Vitamin D deficiency 12/21/2021   Diuretic-induced hypokalemia 12/21/2021   Hyperlipidemia associated with type 2 diabetes mellitus ([place]) 04/02/2021   Type 2 diabetes mellitus without complication, with long-term current use of insulin ([place]) 08/25/2020   Class 1 obesity due to excess calories with serious comorbidity and body mass index (BMI) of 31.0 to 31.9 in adult 08/31/2019   Seasonal allergic rhinitis due to pollen 08/28/2019   Lipoma of torso 02/15/2018   Routine general medical examination at a health care facility 04/20/2017   Left knee pain 12/07/2016   Prediabetes 08/12/2013   DEGENERATION, CERVICAL DISC 03/16/2007   OSTEOARTHRITIS 03/01/2007   ANEMIA-IRON DEFICIENCY 02/28/2007   Essential hypertension 02/28/2007   GERD 02/28/2007    Past Surgical History:  Procedure Laterality Date   ABDOMINAL HYSTERECTOMY     CESAREAN SECTION     FOOT SURGERY     RADIOACTIVE SEED GUIDED EXCISIONAL BREAST BIOPSY Right 11/10/2020   Procedure:  RADIOACTIVE SEED GUIDED EXCISIONAL RIGHT BREAST BIOPSY;  Surgeon: Johnathan Hausen, MD;  Location: [facility];  Service: General;  Laterality: Right;  34 MINUTES ROOM 1   TUBAL LIGATION      OB History   No obstetric history on file.      Home Medications    Prior to Admission medications   Medication Sig Start Date End Date Taking? Authorizing Provider  albuterol (VENTOLIN HFA) 108 (90 Base) MCG/ACT inhaler Inhale 2 puffs into the lungs every 4 (four) hours as needed for wheezing or shortness of breath. 07/11/22  Yes Parnell Spieler, Gwenlyn Perking, MD  benzonatate (TESSALON) 100 MG capsule Take 1 capsule (100 mg total) by mouth 3 (three) times daily as needed for cough. 07/11/22  Yes Barrett Henle, MD  predniSONE (DELTASONE) 20 MG tablet Take 2 tablets (40 mg total) by mouth daily with breakfast for 5 days. 07/11/22 07/16/22 Yes Barrett Henle, MD  amLODipine-benazepril (LOTREL) 10-40 MG capsule TAKE 1 CAPSULE BY MOUTH  DAILY 12/04/17   Hoyt Koch, MD  aspirin 81 MG tablet Take 81 mg by mouth daily.    [provider]  atorvastatin (LIPITOR) 20 MG tablet TAKE 1 TABLET(20 MG) BY MOUTH DAILY 06/20/22   Henson, Vickie L, NP-C  chlorthalidone (HYGROTEN) 15 MG tablet Take 15 mg by mouth daily.    [provider]  cholecalciferol (VITAMIN  D) 1000 units tablet Take 1,000 Units by mouth daily.    [provider]  metoprolol succinate (TOPROL-XL) 50 MG 24 hr tablet Take 1 tablet (50 mg total) by mouth daily. 04/28/22   Henson, Vickie L, NP-C  Multiple Vitamin (MULTIVITAMIN WITH MINERALS) TABS tablet Take 1 tablet by mouth daily.    [provider]  pantoprazole (PROTONIX) 40 MG tablet TAKE 1 TABLET(40 MG) BY MOUTH DAILY 05/02/22   Henson, Vickie L, NP-C  potassium chloride (KLOR-CON M) 10 MEQ tablet TAKE 1 TABLET(10 MEQ) BY MOUTH DAILY 07/04/22   Girtha Rm, NP-C    Family History Family History  Problem Relation Age of Onset    Peripheral vascular disease Father    Anuerysm Father    Diabetes Mother    Hypertension Mother    Breast cancer Mother     Social History Social History   Tobacco Use   Smoking status: Never   Smokeless tobacco: Never  Vaping Use   Vaping Use: Never used  Substance Use Topics   Alcohol use: Yes    Comment: rarely   Drug use: Never     Allergies   Codeine phosphate, Hydrochlorothiazide, and Morphine sulfate   Review of Systems Review of Systems  Respiratory:  Positive for cough.      Physical Exam Triage Vital Signs ED Triage Vitals  Enc Vitals Group     BP 07/11/22 2016 (!) 142/80     Pulse Rate 07/11/22 2016 89     Resp 07/11/22 2016 20     Temp 07/11/22 2016 98.5 F (36.9 C)     Temp Source 07/11/22 2016 Oral     SpO2 07/11/22 2016 96 %     Weight --      Height --      Head Circumference --      Peak Flow --      Pain Score 07/11/22 2014 2     Pain Loc --      Pain Edu? --      Excl. in [place]? --    No data found.  Updated Vital Signs BP (!) 142/80 (BP Location: Left Arm)   Pulse 89   Temp 98.5 F (36.9 C) (Oral)   Resp 20   SpO2 96%   Visual Acuity Right Eye Distance:   Left Eye Distance:   Bilateral Distance:    Right Eye Near:   Left Eye Near:    Bilateral Near:     Physical Exam Vitals reviewed.  Constitutional:      General: She is not in acute distress.    Appearance: She is not toxic-appearing.  HENT:     Right Ear: Tympanic membrane and ear canal normal.     Left Ear: Tympanic membrane and ear canal normal.     Nose: Congestion present.     Mouth/Throat:     Mouth: Mucous membranes are moist.     Comments: Clear mucus is draining in the oropharynx Eyes:     Extraocular Movements: Extraocular movements intact.     Conjunctiva/sclera: Conjunctivae normal.     Pupils: Pupils are equal, round, and reactive to light.  Cardiovascular:     Rate and Rhythm: Normal rate and regular rhythm.     Heart sounds: No murmur  heard. Pulmonary:     Effort: Pulmonary effort is normal. No respiratory distress.     Breath sounds: No stridor. No rhonchi or rales.     Comments: Expiratory phase  is reduced.  She also sounds a little wheezy when she coughs. Musculoskeletal:     Cervical back: Neck supple.  Lymphadenopathy:     Cervical: No cervical adenopathy.  Skin:    Capillary Refill: Capillary refill takes less than 2 seconds.     Coloration: Skin is not jaundiced or pale.  Neurological:     General: No focal deficit present.     Mental Status: She is alert and oriented to person, place, and time.  Psychiatric:        Behavior: Behavior normal.      UC Treatments / Results  Labs (all labs ordered are listed, but only abnormal results are displayed) Labs Reviewed - No data to display  EKG   Radiology DG Chest 2 View  Result Date: 07/11/2022 CLINICAL DATA:  Cough, shortness of breath EXAM: CHEST - 2 VIEW COMPARISON:  03/13/2007 FINDINGS: The heart size and mediastinal contours are within normal limits. Both lungs are clear. The visualized skeletal structures are unremarkable. IMPRESSION: No active cardiopulmonary disease. Electronically Signed   By: Davina Poke D.O.   On: 07/11/2022 20:40    Procedures Procedures (including critical care time)  Medications Ordered in UC Medications - No data to display  Initial Impression / Assessment and Plan / UC Course  I have reviewed the triage vital signs and the nursing notes.  Pertinent labs & imaging results that were available during my care of the patient were reviewed by me and considered in my medical decision making (see chart for details).        Chest x-ray is clear.  Going to treat for possible intermittent asthma exacerbation and provide cough relief. Final Clinical Impressions(s) / UC Diagnoses   Final diagnoses:  Acute cough  Viral upper respiratory tract infection  Mild intermittent asthma with exacerbation     Discharge  Instructions      Your chest x-ray was read as clear.  Albuterol inhaler--do 2 puffs every 4 hours as needed for shortness of breath or wheezing  Take prednisone 20 mg--2 daily for 5 days  Take benzonatate 100 mg, 1 tab every 8 hours as needed for cough.       ED Prescriptions     Medication Sig Dispense Auth. Provider   albuterol (VENTOLIN HFA) 108 (90 Base) MCG/ACT inhaler Inhale 2 puffs into the lungs every 4 (four) hours as needed for wheezing or shortness of breath. 1 each Barrett Henle, MD   predniSONE (DELTASONE) 20 MG tablet Take 2 tablets (40 mg total) by mouth daily with breakfast for 5 days. 10 tablet Barrett Henle, MD   benzonatate (TESSALON) 100 MG capsule Take 1 capsule (100 mg total) by mouth 3 (three) times daily as needed for cough. 21 capsule Barrett Henle, MD      PDMP not reviewed this encounter.   Barrett Henle, MD 07/11/22 [phone]

## 2022-07-11 NOTE — Discharge Instructions (Addendum)
Your chest x-ray was read as clear.  Albuterol inhaler--do 2 puffs every 4 hours as needed for shortness of breath or wheezing  Take prednisone 20 mg--2 daily for 5 days  Take benzonatate 100 mg, 1 tab every 8 hours as needed for cough.

## 2022-07-17 ENCOUNTER — Encounter: Payer: Self-pay | Admitting: Family Medicine

## 2022-07-17 DIAGNOSIS — I10 Essential (primary) hypertension: Secondary | ICD-10-CM

## 2022-07-17 DIAGNOSIS — K219 Gastro-esophageal reflux disease without esophagitis: Secondary | ICD-10-CM

## 2022-07-18 MED ORDER — AMLODIPINE BESY-BENAZEPRIL HCL 10-40 MG PO CAPS: 1.0000 | ORAL_CAPSULE | Freq: Every day | ORAL | 3 refills | Status: DC

## 2022-07-18 MED ORDER — PANTOPRAZOLE SODIUM 40 MG PO TBEC: DELAYED_RELEASE_TABLET | ORAL | 3 refills | Status: DC

## 2022-07-18 NOTE — Telephone Encounter (Signed)
Amlodipine-benaz will be 1st fill with you, ok to refill?

## 2022-08-08 ENCOUNTER — Other Ambulatory Visit: Payer: Self-pay | Admitting: Family Medicine

## 2022-08-08 DIAGNOSIS — I10 Essential (primary) hypertension: Secondary | ICD-10-CM

## 2022-09-01 LAB — HM DIABETES EYE EXAM

## 2022-09-21 ENCOUNTER — Other Ambulatory Visit: Payer: Self-pay | Admitting: Family Medicine

## 2022-09-21 ENCOUNTER — Telehealth: Payer: Self-pay | Admitting: Family Medicine

## 2022-09-21 DIAGNOSIS — Z1231 Encounter for screening mammogram for malignant neoplasm of breast: Secondary | ICD-10-CM

## 2022-09-21 MED ORDER — CHLORTHALIDONE 15 MG PO TABS: 15.0000 mg | ORAL_TABLET | Freq: Every day | ORAL | 2 refills | Status: DC

## 2022-09-21 NOTE — Telephone Encounter (Signed)
Caller & Relationship to patient:  patient   Call back [phone]   Date of last office visit:   Date of next office visit:   Medication(s) to be refilled: chlorithiadone 15 mg.       Preferred Pharmacy:  [facility] on [address]

## 2022-09-21 NOTE — Telephone Encounter (Signed)
Refill has been sent to [facility].Marland KitchenJohny Chess

## 2022-10-22 ENCOUNTER — Other Ambulatory Visit: Payer: Self-pay | Admitting: Family Medicine

## 2022-10-22 DIAGNOSIS — I10 Essential (primary) hypertension: Secondary | ICD-10-CM

## 2022-10-22 DIAGNOSIS — T502X5A Adverse effect of carbonic-anhydrase inhibitors, benzothiadiazides and other diuretics, initial encounter: Secondary | ICD-10-CM

## 2022-11-02 ENCOUNTER — Ambulatory Visit
Admission: RE | Admit: 2022-11-02 | Discharge: 2022-11-02 | Disposition: A | Discharge status: Home / Self Care | Payer: BC Managed Care – PPO | Source: Ambulatory Visit | Attending: Family Medicine | Admitting: Family Medicine

## 2022-11-02 DIAGNOSIS — Z1231 Encounter for screening mammogram for malignant neoplasm of breast: Secondary | ICD-10-CM | POA: Diagnosis not present

## 2022-11-21 ENCOUNTER — Other Ambulatory Visit: Payer: Self-pay | Admitting: Family Medicine

## 2022-11-21 DIAGNOSIS — I10 Essential (primary) hypertension: Secondary | ICD-10-CM

## 2022-12-09 IMAGING — MG MM DIGITAL SCREENING BILAT W/ TOMO AND CAD
8 series · 8 of 24 positions shown · non-contrast
Comparison: Previous exam(s).

CLINICAL DATA: Screening.

EXAM:
DIGITAL SCREENING BILATERAL MAMMOGRAM WITH TOMOSYNTHESIS AND CAD
TECHNIQUE: Bilateral screening digital craniocaudal and mediolateral oblique
mammograms were obtained. Bilateral screening digital breast
tomosynthesis was performed. The images were evaluated with
computer-aided detection.

[L CC synth-2D]
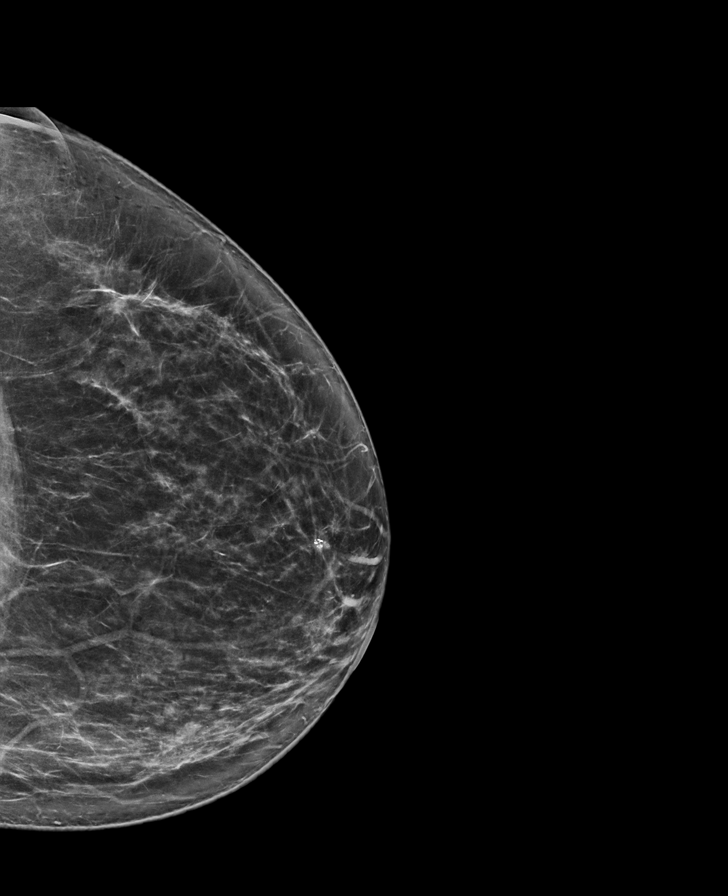

[R MLO synth-2D]
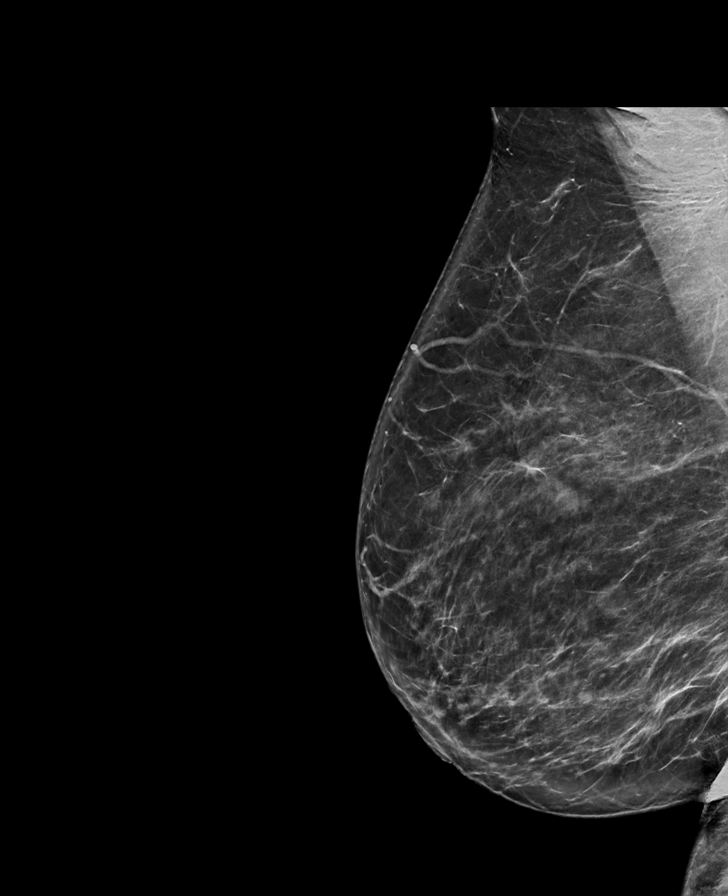

[R CC synth-2D]
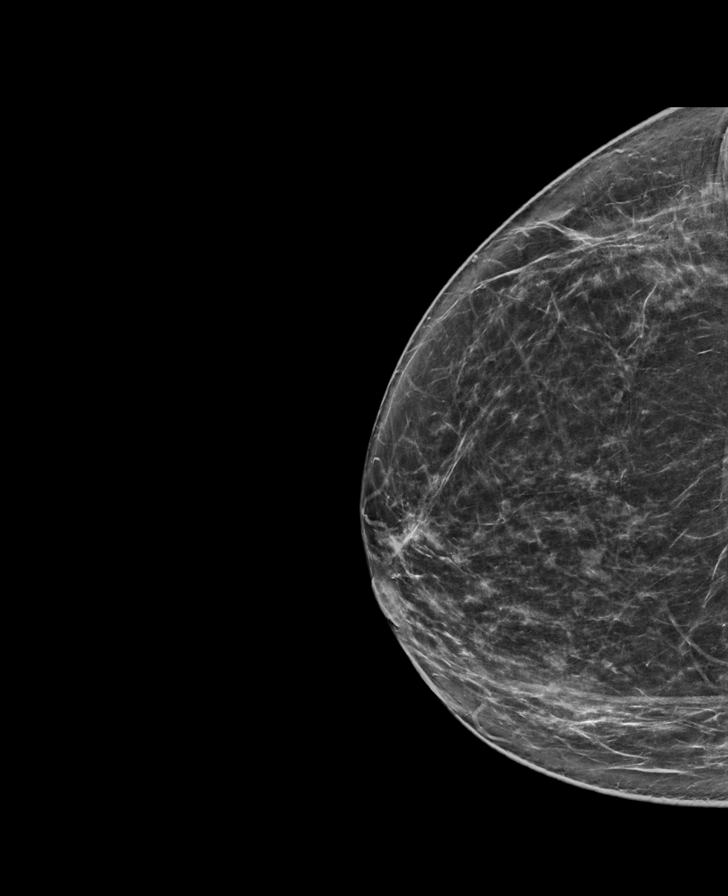

[L MLO synth-2D]
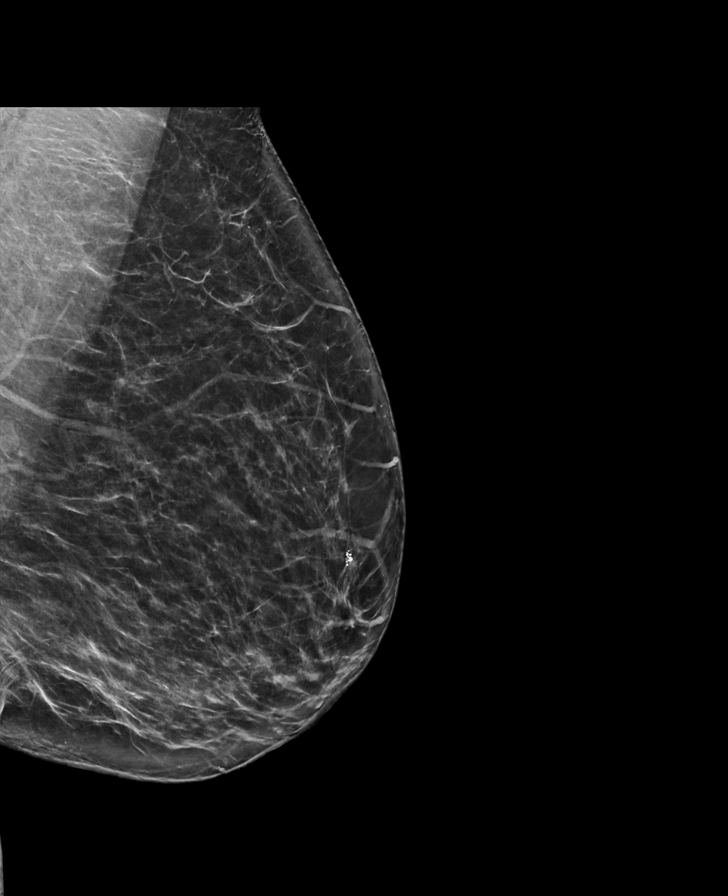

[L CC tomo · tomo slice 39/78.0]
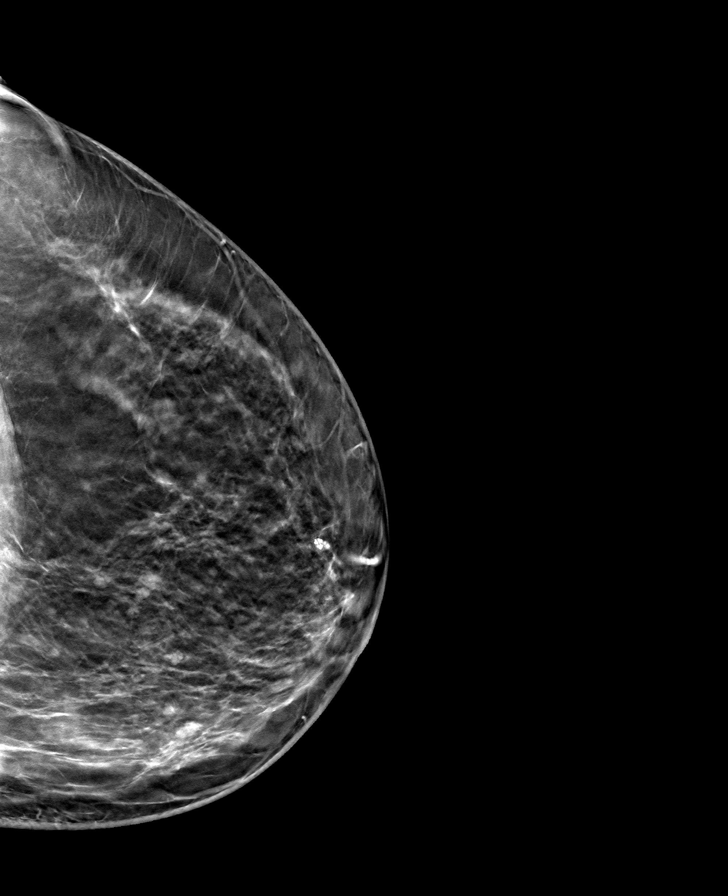

[R MLO tomo · tomo slice 45/90.0]
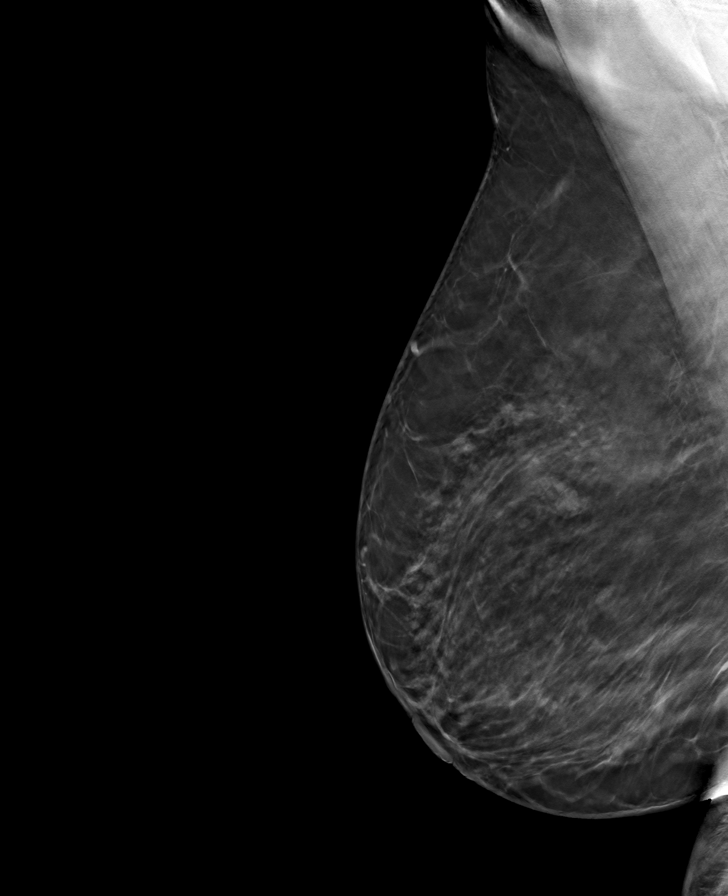

[R CC tomo · tomo slice 41/82.0]
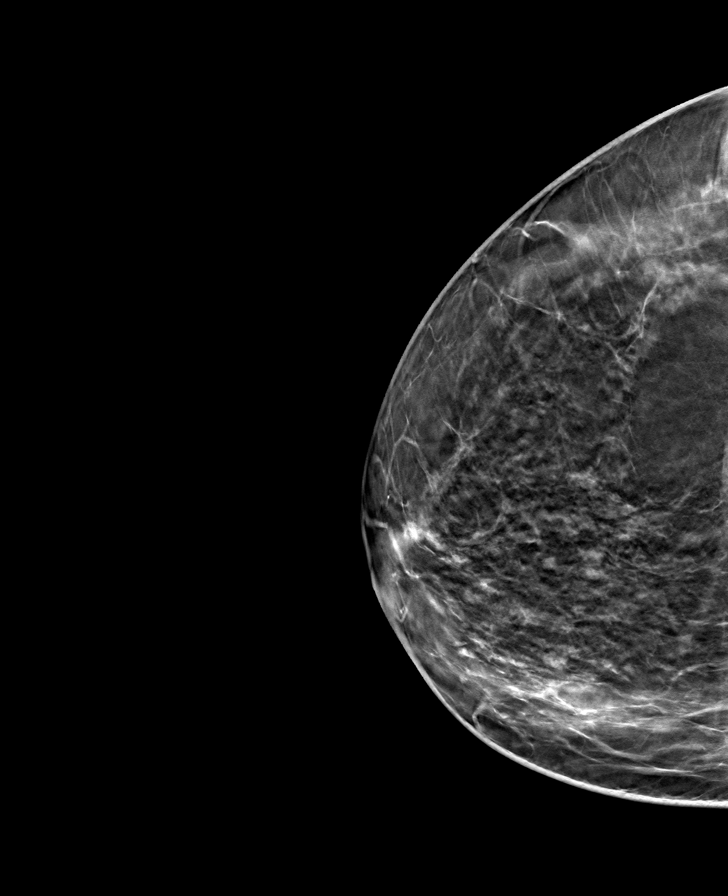

[L MLO tomo · tomo slice 43/84.0]
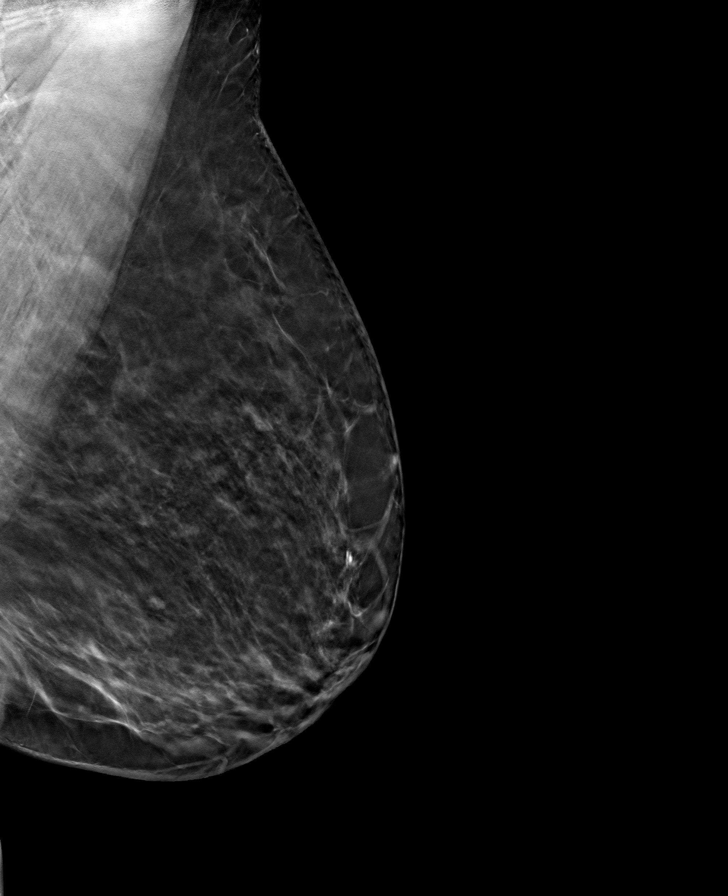

[8 of 24 positions shown; findings below may reference images not displayed]

ACR Breast Density Category b: There are scattered areas of
fibroglandular density.
FINDINGS: There are no findings suspicious for malignancy.
IMPRESSION: No mammographic evidence of malignancy. A result letter of this
screening mammogram will be mailed directly to the patient.

RECOMMENDATION:
Screening mammogram in one year. (Code:51-O-LD2)

BI-RADS CATEGORY  1: Negative.

## 2022-12-27 ENCOUNTER — Ambulatory Visit (INDEPENDENT_AMBULATORY_CARE_PROVIDER_SITE_OTHER): Payer: BC Managed Care – PPO | Admitting: Family Medicine

## 2022-12-27 ENCOUNTER — Encounter: Payer: Self-pay | Admitting: Family Medicine

## 2022-12-27 VITALS — BP 130/86 | HR 82 | Temp 97.6°F | Ht 66.0 in | Wt 195.0 lb

## 2022-12-27 DIAGNOSIS — K219 Gastro-esophageal reflux disease without esophagitis: Secondary | ICD-10-CM

## 2022-12-27 DIAGNOSIS — I10 Essential (primary) hypertension: Secondary | ICD-10-CM

## 2022-12-27 DIAGNOSIS — E876 Hypokalemia: Secondary | ICD-10-CM

## 2022-12-27 DIAGNOSIS — E785 Hyperlipidemia, unspecified: Secondary | ICD-10-CM

## 2022-12-27 DIAGNOSIS — F419 Anxiety disorder, unspecified: Secondary | ICD-10-CM

## 2022-12-27 DIAGNOSIS — Z6831 Body mass index (BMI) 31.0-31.9, adult: Secondary | ICD-10-CM | POA: Diagnosis not present

## 2022-12-27 DIAGNOSIS — T502X5A Adverse effect of carbonic-anhydrase inhibitors, benzothiadiazides and other diuretics, initial encounter: Secondary | ICD-10-CM

## 2022-12-27 DIAGNOSIS — E669 Obesity, unspecified: Secondary | ICD-10-CM | POA: Diagnosis not present

## 2022-12-27 DIAGNOSIS — R252 Cramp and spasm: Secondary | ICD-10-CM | POA: Diagnosis not present

## 2022-12-27 DIAGNOSIS — E1169 Type 2 diabetes mellitus with other specified complication: Secondary | ICD-10-CM | POA: Diagnosis not present

## 2022-12-27 LAB — CBC WITH DIFFERENTIAL/PLATELET
Basophils Absolute: 0 10*3/uL (ref 0.0–0.1)
Basophils Relative: 0.5 % (ref 0.0–3.0)
Eosinophils Absolute: 0.2 10*3/uL (ref 0.0–0.7)
Eosinophils Relative: 2.2 % (ref 0.0–5.0)
HCT: 39.9 % (ref 36.0–46.0)
Hemoglobin: 13.4 g/dL (ref 12.0–15.0)
Lymphocytes Relative: 31.7 % (ref 12.0–46.0)
Lymphs Abs: 2.2 10*3/uL (ref 0.7–4.0)
MCHC: 33.7 g/dL (ref 30.0–36.0)
MCV: 88.8 fl (ref 78.0–100.0)
Monocytes Absolute: 0.6 10*3/uL (ref 0.1–1.0)
Monocytes Relative: 8.3 % (ref 3.0–12.0)
Neutro Abs: 4 10*3/uL (ref 1.4–7.7)
Neutrophils Relative %: 57.3 % (ref 43.0–77.0)
Platelets: 270 10*3/uL (ref 150.0–400.0)
RBC: 4.49 Mil/uL (ref 3.87–5.11)
RDW: 13.1 % (ref 11.5–15.5)
WBC: 7.1 10*3/uL (ref 4.0–10.5)

## 2022-12-27 LAB — COMPREHENSIVE METABOLIC PANEL
ALT: 23 U/L (ref 0–35)
AST: 22 U/L (ref 0–37)
Albumin: 4.4 g/dL (ref 3.5–5.2)
Alkaline Phosphatase: 64 U/L (ref 39–117)
BUN: 16 mg/dL (ref 6–23)
CO2: 29 mEq/L (ref 19–32)
Calcium: 9.6 mg/dL (ref 8.4–10.5)
Chloride: 101 mEq/L (ref 96–112)
Creatinine, Ser: 0.96 mg/dL (ref 0.40–1.20)
GFR: 64.63 mL/min (ref >60.00)
Glucose, Bld: 92 mg/dL (ref 70–99)
Potassium: 4.2 mEq/L (ref 3.5–5.1)
Sodium: 138 mEq/L (ref 135–145)
Total Bilirubin: 0.4 mg/dL (ref 0.2–1.2)
Total Protein: 7.5 g/dL (ref 6.0–8.3)

## 2022-12-27 LAB — LIPID PANEL
Cholesterol: 126 mg/dL (ref 0–200)
HDL: 58 mg/dL (ref >39.00)
LDL Cholesterol: 49 mg/dL (ref 0–99)
NonHDL: 68.24
Total CHOL/HDL Ratio: 2
Triglycerides: 94 mg/dL (ref 0.0–149.0)
VLDL: 18.8 mg/dL (ref 0.0–40.0)

## 2022-12-27 LAB — MICROALBUMIN / CREATININE URINE RATIO
Creatinine,U: 53.3 mg/dL
Microalb Creat Ratio: 1.3 mg/g (ref 0.0–30.0)
Microalb, Ur: <0.7 mg/dL (ref 0.0–1.9)

## 2022-12-27 LAB — VITAMIN B12: Vitamin B-12: 403 pg/mL (ref 211–911)

## 2022-12-27 LAB — T4, FREE: Free T4: 0.96 ng/dL (ref 0.60–1.60)

## 2022-12-27 LAB — HEMOGLOBIN A1C: Hgb A1c MFr Bld: 6.2 % (ref 4.6–6.5)

## 2022-12-27 LAB — MAGNESIUM: Magnesium: 2 mg/dL (ref 1.5–2.5)

## 2022-12-27 LAB — TSH: TSH: 1.58 u[IU]/mL (ref 0.35–5.50)

## 2022-12-27 MED ORDER — CHLORTHALIDONE 15 MG PO TABS: 15.0000 mg | ORAL_TABLET | Freq: Every day | ORAL | 2 refills | Status: DC

## 2022-12-27 MED ORDER — AMLODIPINE BESY-BENAZEPRIL HCL 10-40 MG PO CAPS: 1.0000 | ORAL_CAPSULE | Freq: Every day | ORAL | 1 refills | Status: DC

## 2022-12-27 MED ORDER — ATORVASTATIN CALCIUM 20 MG PO TABS: ORAL_TABLET | ORAL | 1 refills | Status: DC

## 2022-12-27 MED ORDER — METOPROLOL SUCCINATE ER 50 MG PO TB24: ORAL_TABLET | ORAL | 1 refills | Status: DC

## 2022-12-27 MED ORDER — POTASSIUM CHLORIDE CRYS ER 10 MEQ PO TBCR: EXTENDED_RELEASE_TABLET | ORAL | 2 refills | Status: DC

## 2022-12-27 MED ORDER — PANTOPRAZOLE SODIUM 40 MG PO TBEC: DELAYED_RELEASE_TABLET | ORAL | 1 refills | Status: DC

## 2022-12-27 NOTE — Assessment & Plan Note (Signed)
Refill Protonix for prn use. Avoid triggers.  

## 2022-12-27 NOTE — Progress Notes (Signed)
Subjective:     Patient ID: Gina Padilla, female    DOB: Oct 02, 1960, 62 y.o.   MRN: [id number]  Chief Complaint  Patient presents with   Medical Management of Chronic Issues    6 month f/u    HPI Patient is in today for f/u on chronic health conditions.   HTN- taking amlodipine-benazepril 10-40 mg and Toprol XL 50 mg daily.  She has been out of chlorthalidone for the past month. She has been taking potassium supplement some days.   HLD- taking atorvastatin 20 mg daily.   DM- diet controlled. Does not check BS.  Eye exam UTD.  States she has not been drinking much water.   Leg cramps some days.    GERD- taking pantoprazole.   Does not exercise.   Drives patients to appointments, [facility]   Anxiety is becoming an issue. Panic attacks 1-2 months ago. Infrequent.  Triggers include death of first spouse 20 years ago and now her sons are the same age as when her husband passed.  She has been evaluated by cardiologist in the past 2 years.     Health Maintenance Due  Topic Date Due   FOOT EXAM  Never done   COVID-19 Vaccine (3 - 2023-24 season) 04/01/2022   OPHTHALMOLOGY EXAM  08/25/2022    Past Medical History:  Diagnosis Date   Anemia    Arthritis    GERD (gastroesophageal reflux disease)    Hypertension     Past Surgical History:  Procedure Laterality Date   ABDOMINAL HYSTERECTOMY     BREAST EXCISIONAL BIOPSY Right    CESAREAN SECTION     FOOT SURGERY     RADIOACTIVE SEED GUIDED EXCISIONAL BREAST BIOPSY Right 11/10/2020   Procedure: RADIOACTIVE SEED GUIDED EXCISIONAL RIGHT BREAST BIOPSY;  Surgeon: Luretha Murphy, MD;  Location: [facility];  Service: General;  Laterality: Right;  90 MINUTES ROOM 1   TUBAL LIGATION      Family History  Problem Relation Age of Onset   Peripheral vascular disease Father    Anuerysm Father    Diabetes Mother    Hypertension Mother    Breast cancer Mother     Social History   Socioeconomic History    Marital status: Widowed    Spouse name: Not on file   Number of children: Not on file   Years of education: Not on file   Highest education level: 12th grade  Occupational History   Not on file  Tobacco Use   Smoking status: Never   Smokeless tobacco: Never  Vaping Use   Vaping Use: Never used  Substance and Sexual Activity   Alcohol use: Yes    Comment: rarely   Drug use: Never   Sexual activity: Yes  Other Topics Concern   Not on file  Social History Narrative   Not on file   Social Determinants of Health   Financial Resource Strain: Low Risk  (12/26/2022)   Overall Financial Resource Strain (CARDIA)    Difficulty of Paying Living Expenses: Not hard at all  Food Insecurity: No Food Insecurity (12/26/2022)   Hunger Vital Sign    Worried About Running Out of Food in the Last Year: Never true    Ran Out of Food in the Last Year: Never true  Transportation Needs: No Transportation Needs (12/26/2022)   PRAPARE - Administrator, Civil Service (Medical): No    Lack of Transportation (Non-Medical): No  Physical Activity: Sufficiently Active (  12/26/2022)   Exercise Vital Sign    Days of Exercise per Week: 5 days    Minutes of Exercise per Session: 30 min  Stress: No Stress Concern Present (12/26/2022)   [employer] of Occupational Health - Occupational Stress Questionnaire    Feeling of Stress : Not at all  Social Connections: Moderately Integrated (12/26/2022)   Social Connection and Isolation Panel [NHANES]    Frequency of Communication with Friends and Family: Twice a week    Frequency of Social Gatherings with Friends and Family: Once a week    Attends Religious Services: More than 4 times per year    Active Member of [employer] or Organizations: No    Attends Engineer, structural: Not on file    Marital Status: Living with partner  Intimate Partner Violence: Not on file    Outpatient Medications Prior to Visit  Medication Sig Dispense Refill    albuterol (VENTOLIN HFA) 108 (90 Base) MCG/ACT inhaler Inhale 2 puffs into the lungs every 4 (four) hours as needed for wheezing or shortness of breath. 1 each 0   aspirin 81 MG tablet Take 81 mg by mouth daily.     cholecalciferol (VITAMIN D) 1000 units tablet Take 1,000 Units by mouth daily.     Multiple Vitamin (MULTIVITAMIN WITH MINERALS) TABS tablet Take 1 tablet by mouth daily.     amLODipine-benazepril (LOTREL) 10-40 MG capsule TAKE 1 CAPSULE BY MOUTH DAILY 90 capsule 1   atorvastatin (LIPITOR) 20 MG tablet TAKE 1 TABLET(20 MG) BY MOUTH DAILY 90 tablet 1   chlorthalidone (HYGROTEN) 15 MG tablet Take 1 tablet (15 mg total) by mouth daily. 30 tablet 2   metoprolol succinate (TOPROL-XL) 50 MG 24 hr tablet TAKE 1 TABLET(50 MG) BY MOUTH DAILY 90 tablet 0   pantoprazole (PROTONIX) 40 MG tablet TAKE 1 TABLET(40 MG) BY MOUTH DAILY 30 tablet 3   potassium chloride (KLOR-CON M) 10 MEQ tablet TAKE 1 TABLET(10 MEQ) BY MOUTH DAILY 30 tablet 2   benzonatate (TESSALON) 100 MG capsule Take 1 capsule (100 mg total) by mouth 3 (three) times daily as needed for cough. (Patient not taking: Reported on 12/27/2022) 21 capsule 0   No facility-administered medications prior to visit.    Allergies  Allergen Reactions   Codeine Phosphate    Hydrochlorothiazide     SOB, flutter in chest, difficult to inhale   Morphine Sulfate     Review of Systems  Constitutional:  Negative for chills, fever, malaise/fatigue and weight loss.  Respiratory:  Negative for shortness of breath.   Cardiovascular:  Negative for chest pain, palpitations and leg swelling.  Gastrointestinal:  Negative for abdominal pain, constipation, diarrhea, nausea and vomiting.  Genitourinary:  Negative for dysuria, frequency and urgency.  Musculoskeletal:  Positive for myalgias. Negative for falls.  Neurological:  Negative for dizziness, focal weakness and headaches.  Endo/Heme/Allergies:  Negative for polydipsia.  Psychiatric/Behavioral:   Negative for depression and suicidal ideas. The patient is nervous/anxious. The patient does not have insomnia.        Objective:    Physical Exam Constitutional:      General: She is not in acute distress.    Appearance: She is not ill-appearing.  Eyes:     Extraocular Movements: Extraocular movements intact.     Conjunctiva/sclera: Conjunctivae normal.  Cardiovascular:     Rate and Rhythm: Normal rate and regular rhythm.  Pulmonary:     Effort: Pulmonary effort is normal.     Breath sounds:  Normal breath sounds.  Musculoskeletal:     Cervical back: Normal range of motion and neck supple.     Right lower leg: No edema.     Left lower leg: No edema.  Skin:    General: Skin is warm and dry.  Neurological:     General: No focal deficit present.     Mental Status: She is alert and oriented to person, place, and time.     Motor: No weakness.     Coordination: Coordination normal.     Gait: Gait normal.  Psychiatric:        Mood and Affect: Mood normal.        Behavior: Behavior normal.        Thought Content: Thought content normal.     BP 130/86 (BP Location: Left Arm, Patient Position: Sitting, Cuff Size: Large)   Pulse 82   Temp 97.6 F (36.4 C) (Temporal)   Ht 5\' 6"  (1.676 m)   Wt 195 lb (88.5 kg)   SpO2 98%   BMI 31.47 kg/m  Wt Readings from Last 3 Encounters:  12/27/22 195 lb (88.5 kg)  06/28/22 194 lb (88 kg)  03/23/22 191 lb (86.6 kg)       Assessment & Plan:   Problem List Items Addressed This Visit       Cardiovascular and Mediastinum   Essential hypertension    Continue Toprol XL and and Lotrel. Check labs and refill chlorthalidone and K-dur. Hx of allergy listed to HCTZ so I will not start her on Dyazide. Monitor BP at home. Follow up in 6 months or sooner if needed.       Relevant Medications   metoprolol succinate (TOPROL-XL) 50 MG 24 hr tablet   amLODipine-benazepril (LOTREL) 10-40 MG capsule   atorvastatin (LIPITOR) 20 MG tablet    chlorthalidone (THALITONE) 15 MG tablet   Other Relevant Orders   TSH (Completed)   T4, free (Completed)     Digestive   GERD    Refill Protonix for prn use. Avoid triggers.       Relevant Medications   pantoprazole (PROTONIX) 40 MG tablet     Endocrine   Type 2 diabetes mellitus with obesity (HCC) - Primary    Diet controlled. Check A1c. Continue statin. On benazepril. Eye exam UTD.       Relevant Medications   amLODipine-benazepril (LOTREL) 10-40 MG capsule   atorvastatin (LIPITOR) 20 MG tablet   Other Relevant Orders   CBC with Differential/Platelet (Completed)   Comprehensive metabolic panel (Completed)   Hemoglobin A1c (Completed)   Vitamin B12 (Completed)   Microalbumin / creatinine urine ratio (Completed)     Other   Anxiety    Recommend counseling and she is amenable. She will call to schedule. Follow up if not improving.       Relevant Orders   TSH (Completed)   T4, free (Completed)   Diuretic-induced hypokalemia    She has been out of diuretic x 3 wks. Is not currently taking K-dur regularly. Consider switching her to Dyazide. Check BMP and follow up.       Relevant Medications   potassium chloride (KLOR-CON M) 10 MEQ tablet   Other Relevant Orders   Comprehensive metabolic panel (Completed)   Leg cramps    Check labs, hydrate, follow up      Relevant Orders   Magnesium (Completed)   Vitamin B12 (Completed)   TSH (Completed)   T4, free (Completed)    I have  discontinued Tityana Nudo. Iturralde's benzonatate. I have also changed her chlorthalidone. Additionally, I am having her maintain her cholecalciferol, aspirin, multivitamin with minerals, albuterol, pantoprazole, metoprolol succinate, amLODipine-benazepril, atorvastatin, and potassium chloride.  Meds ordered this encounter  Medications   pantoprazole (PROTONIX) 40 MG tablet    Sig: TAKE 1 TABLET(40 MG) BY MOUTH DAILY    Dispense:  90 tablet    Refill:  1   metoprolol succinate (TOPROL-XL) 50 MG  24 hr tablet    Sig: TAKE 1 TABLET(50 MG) BY MOUTH DAILY    Dispense:  90 tablet    Refill:  1   amLODipine-benazepril (LOTREL) 10-40 MG capsule    Sig: Take 1 capsule by mouth daily.    Dispense:  90 capsule    Refill:  1   atorvastatin (LIPITOR) 20 MG tablet    Sig: TAKE 1 TABLET(20 MG) BY MOUTH DAILY    Dispense:  90 tablet    Refill:  1   chlorthalidone (THALITONE) 15 MG tablet    Sig: Take 1 tablet (15 mg total) by mouth daily.    Dispense:  30 tablet    Refill:  2    Order Specific Question:   Supervising Provider    Answer:   Hillard Danker A [[id number]]   potassium chloride (KLOR-CON M) 10 MEQ tablet    Sig: TAKE 1 TABLET(10 MEQ) BY MOUTH DAILY    Dispense:  30 tablet    Refill:  2    Order Specific Question:   Supervising Provider    Answer:   Hillard Danker A [[id number]]

## 2022-12-27 NOTE — Assessment & Plan Note (Signed)
She has been out of diuretic x 3 wks. Is not currently taking K-dur regularly. Consider switching her to Dyazide. Check BMP and follow up.

## 2022-12-27 NOTE — Patient Instructions (Signed)
   [email or web address]  [email or web address]   [facility] [address] [place], [place] [address]  Phone: [phone]  [facility]  [address], [place], [place] [address]  Phone: [phone]

## 2022-12-27 NOTE — Assessment & Plan Note (Signed)
Recommend counseling and she is amenable. She will call to schedule. Follow up if not improving.

## 2022-12-27 NOTE — Assessment & Plan Note (Addendum)
Continue Toprol XL and and Lotrel. Check labs and refill chlorthalidone and K-dur. Hx of allergy listed to HCTZ so I will not start her on Dyazide. Monitor BP at home. Follow up in 6 months or sooner if needed.

## 2022-12-27 NOTE — Assessment & Plan Note (Signed)
Diet controlled. Check A1c. Continue statin. On benazepril. Eye exam UTD.

## 2022-12-27 NOTE — Assessment & Plan Note (Signed)
Check labs, hydrate, follow up

## 2023-04-20 ENCOUNTER — Other Ambulatory Visit: Payer: Self-pay | Admitting: Family Medicine

## 2023-04-20 DIAGNOSIS — E876 Hypokalemia: Secondary | ICD-10-CM

## 2023-04-20 NOTE — Telephone Encounter (Signed)
Are we continuing this medication? Last potassium level was 4.2 on 12/27/2022

## 2023-05-20 ENCOUNTER — Other Ambulatory Visit: Payer: Self-pay | Admitting: Family Medicine

## 2023-05-20 DIAGNOSIS — E876 Hypokalemia: Secondary | ICD-10-CM

## 2023-06-19 ENCOUNTER — Other Ambulatory Visit: Payer: Self-pay | Admitting: Family Medicine

## 2023-06-19 DIAGNOSIS — I10 Essential (primary) hypertension: Secondary | ICD-10-CM

## 2023-06-19 DIAGNOSIS — T502X5A Adverse effect of carbonic-anhydrase inhibitors, benzothiadiazides and other diuretics, initial encounter: Secondary | ICD-10-CM

## 2023-06-19 NOTE — Telephone Encounter (Signed)
Are we continuing potassium rx?  12/27/22 potassium was 4.2

## 2023-06-20 ENCOUNTER — Encounter: Payer: Self-pay | Admitting: Family Medicine

## 2023-06-20 ENCOUNTER — Ambulatory Visit (INDEPENDENT_AMBULATORY_CARE_PROVIDER_SITE_OTHER): Payer: BC Managed Care – PPO | Admitting: Family Medicine

## 2023-06-20 VITALS — BP 138/86 | HR 79 | Temp 97.6°F | Ht 66.0 in | Wt 198.0 lb

## 2023-06-20 DIAGNOSIS — R7303 Prediabetes: Secondary | ICD-10-CM

## 2023-06-20 DIAGNOSIS — Z124 Encounter for screening for malignant neoplasm of cervix: Secondary | ICD-10-CM

## 2023-06-20 DIAGNOSIS — K219 Gastro-esophageal reflux disease without esophagitis: Secondary | ICD-10-CM | POA: Diagnosis not present

## 2023-06-20 DIAGNOSIS — E785 Hyperlipidemia, unspecified: Secondary | ICD-10-CM

## 2023-06-20 DIAGNOSIS — E876 Hypokalemia: Secondary | ICD-10-CM

## 2023-06-20 DIAGNOSIS — K625 Hemorrhage of anus and rectum: Secondary | ICD-10-CM | POA: Diagnosis not present

## 2023-06-20 DIAGNOSIS — E1169 Type 2 diabetes mellitus with other specified complication: Secondary | ICD-10-CM

## 2023-06-20 DIAGNOSIS — E559 Vitamin D deficiency, unspecified: Secondary | ICD-10-CM | POA: Diagnosis not present

## 2023-06-20 DIAGNOSIS — I10 Essential (primary) hypertension: Secondary | ICD-10-CM | POA: Diagnosis not present

## 2023-06-20 DIAGNOSIS — S46312A Strain of muscle, fascia and tendon of triceps, left arm, initial encounter: Secondary | ICD-10-CM

## 2023-06-20 DIAGNOSIS — T502X5A Adverse effect of carbonic-anhydrase inhibitors, benzothiadiazides and other diuretics, initial encounter: Secondary | ICD-10-CM

## 2023-06-20 LAB — COMPREHENSIVE METABOLIC PANEL
ALT: 18 U/L (ref 0–35)
AST: 19 U/L (ref 0–37)
Albumin: 4.7 g/dL (ref 3.5–5.2)
Alkaline Phosphatase: 65 U/L (ref 39–117)
BUN: 15 mg/dL (ref 6–23)
CO2: 29 meq/L (ref 19–32)
Calcium: 9.5 mg/dL (ref 8.4–10.5)
Chloride: 103 meq/L (ref 96–112)
Creatinine, Ser: 0.92 mg/dL (ref 0.40–1.20)
GFR: 67.79 mL/min (ref >60.00)
Glucose, Bld: 90 mg/dL (ref 70–99)
Potassium: 3.5 meq/L (ref 3.5–5.1)
Sodium: 140 meq/L (ref 135–145)
Total Bilirubin: 0.5 mg/dL (ref 0.2–1.2)
Total Protein: 7.8 g/dL (ref 6.0–8.3)

## 2023-06-20 LAB — CBC WITH DIFFERENTIAL/PLATELET
Basophils Absolute: 0.1 10*3/uL (ref 0.0–0.1)
Basophils Relative: 0.8 % (ref 0.0–3.0)
Eosinophils Absolute: 0.2 10*3/uL (ref 0.0–0.7)
Eosinophils Relative: 2.2 % (ref 0.0–5.0)
HCT: 41.8 % (ref 36.0–46.0)
Hemoglobin: 14 g/dL (ref 12.0–15.0)
Lymphocytes Relative: 32.4 % (ref 12.0–46.0)
Lymphs Abs: 2.3 10*3/uL (ref 0.7–4.0)
MCHC: 33.5 g/dL (ref 30.0–36.0)
MCV: 89.1 fL (ref 78.0–100.0)
Monocytes Absolute: 0.5 10*3/uL (ref 0.1–1.0)
Monocytes Relative: 7 % (ref 3.0–12.0)
Neutro Abs: 4.1 10*3/uL (ref 1.4–7.7)
Neutrophils Relative %: 57.6 % (ref 43.0–77.0)
Platelets: 292 10*3/uL (ref 150.0–400.0)
RBC: 4.69 Mil/uL (ref 3.87–5.11)
RDW: 12.7 % (ref 11.5–15.5)
WBC: 7.1 10*3/uL (ref 4.0–10.5)

## 2023-06-20 LAB — HEMOGLOBIN A1C: Hgb A1c MFr Bld: 6.1 % (ref 4.6–6.5)

## 2023-06-20 MED ORDER — CHLORTHALIDONE 15 MG PO TABS: 15.0000 mg | ORAL_TABLET | Freq: Every day | ORAL | 1 refills | Status: DC

## 2023-06-20 MED ORDER — ATORVASTATIN CALCIUM 20 MG PO TABS: ORAL_TABLET | ORAL | 1 refills | Status: AC

## 2023-06-20 MED ORDER — AMLODIPINE BESY-BENAZEPRIL HCL 10-40 MG PO CAPS: 1.0000 | ORAL_CAPSULE | Freq: Every day | ORAL | 1 refills | Status: AC

## 2023-06-20 MED ORDER — PANTOPRAZOLE SODIUM 40 MG PO TBEC: DELAYED_RELEASE_TABLET | ORAL | 1 refills | Status: DC

## 2023-06-20 NOTE — Assessment & Plan Note (Signed)
Checking HgA1c. 

## 2023-06-20 NOTE — Progress Notes (Signed)
Subjective:     Patient ID: Gina Padilla, female    DOB: [birth year], 62 y.o.   MRN: [id number]  Chief Complaint  Patient presents with   Medical Management of Chronic Issues    6 month f/u    HPI   History of Present Illness         She is here for 60-month follow-up on chronic health conditions including hypertension, diabetes, hyperlipidemia, vitamin D deficiency.  Straining to have bowel movement saw bright red blood. No blood since. Stools soft now.  No abdominal pain.     Health Maintenance Due  Topic Date Due   FOOT EXAM  Never done    Past Medical History:  Diagnosis Date   Anemia    Arthritis    GERD (gastroesophageal reflux disease)    Hypertension     Past Surgical History:  Procedure Laterality Date   ABDOMINAL HYSTERECTOMY     BREAST EXCISIONAL BIOPSY Right    CESAREAN SECTION     FOOT SURGERY     RADIOACTIVE SEED GUIDED EXCISIONAL BREAST BIOPSY Right 11/10/2020   Procedure: RADIOACTIVE SEED GUIDED EXCISIONAL RIGHT BREAST BIOPSY;  Surgeon: Luretha Murphy, MD;  Location: [facility];  Service: General;  Laterality: Right;  90 MINUTES ROOM 1   TUBAL LIGATION      Family History  Problem Relation Age of Onset   Peripheral vascular disease Father    Anuerysm Father    Diabetes Mother    Hypertension Mother    Breast cancer Mother     Social History   Socioeconomic History   Marital status: Widowed    Spouse name: Not on file   Number of children: Not on file   Years of education: Not on file   Highest education level: 12th grade  Occupational History   Not on file  Tobacco Use   Smoking status: Never   Smokeless tobacco: Never  Vaping Use   Vaping status: Never Used  Substance and Sexual Activity   Alcohol use: Yes    Comment: rarely   Drug use: Never   Sexual activity: Yes  Other Topics Concern   Not on file  Social History Narrative   Not on file   Social Determinants of Health   Financial Resource  Strain: Low Risk  (06/19/2023)   Overall Financial Resource Strain (CARDIA)    Difficulty of Paying Living Expenses: Not hard at all  Food Insecurity: No Food Insecurity (06/19/2023)   Hunger Vital Sign    Worried About Running Out of Food in the Last Year: Never true    Ran Out of Food in the Last Year: Never true  Transportation Needs: No Transportation Needs (06/19/2023)   PRAPARE - Administrator, Civil Service (Medical): No    Lack of Transportation (Non-Medical): No  Physical Activity: Insufficiently Active (06/19/2023)   Exercise Vital Sign    Days of Exercise per Week: 3 days    Minutes of Exercise per Session: 30 min  Stress: No Stress Concern Present (06/19/2023)   [employer] of Occupational Health - Occupational Stress Questionnaire    Feeling of Stress : Not at all  Social Connections: Moderately Integrated (06/19/2023)   Social Connection and Isolation Panel [NHANES]    Frequency of Communication with Friends and Family: Twice a week    Frequency of Social Gatherings with Friends and Family: Once a week    Attends Religious Services: 1 to 4 times per year  Active Member of Clubs or Organizations: No    Attends Banker Meetings: Not on file    Marital Status: Married  Intimate Partner Violence: Unknown (11/04/2021)   Received from [facility], [facility]   HITS    Physically Hurt: Not on file    Insult or Talk Down To: Not on file    Threaten Physical Harm: Not on file    Scream or Curse: Not on file    Outpatient Medications Prior to Visit  Medication Sig Dispense Refill   albuterol (VENTOLIN HFA) 108 (90 Base) MCG/ACT inhaler Inhale 2 puffs into the lungs every 4 (four) hours as needed for wheezing or shortness of breath. 1 each 0   aspirin 81 MG tablet Take 81 mg by mouth daily.     cholecalciferol (VITAMIN D) 1000 units tablet Take 1,000 Units by mouth daily.     metoprolol succinate (TOPROL-XL) 50 MG 24 hr tablet TAKE 1  TABLET(50 MG) BY MOUTH DAILY 90 tablet 1   Multiple Vitamin (MULTIVITAMIN WITH MINERALS) TABS tablet Take 1 tablet by mouth daily.     potassium chloride (KLOR-CON M) 10 MEQ tablet TAKE 1 TABLET(10 MEQ) BY MOUTH DAILY 30 tablet 0   amLODipine-benazepril (LOTREL) 10-40 MG capsule Take 1 capsule by mouth daily. 90 capsule 1   atorvastatin (LIPITOR) 20 MG tablet TAKE 1 TABLET(20 MG) BY MOUTH DAILY 90 tablet 1   pantoprazole (PROTONIX) 40 MG tablet TAKE 1 TABLET(40 MG) BY MOUTH DAILY 90 tablet 1   THALITONE 15 MG tablet TAKE 1 TABLET(15 MG) BY MOUTH DAILY (Patient not taking: Reported on 06/20/2023) 30 tablet 2   No facility-administered medications prior to visit.    Allergies  Allergen Reactions   Codeine Phosphate    Hydrochlorothiazide     SOB, flutter in chest, difficult to inhale   Morphine Sulfate     Review of Systems  Constitutional:  Negative for chills, fever and malaise/fatigue.  Respiratory:  Negative for shortness of breath.   Cardiovascular:  Negative for chest pain, palpitations and leg swelling.  Gastrointestinal:  Negative for abdominal pain, constipation, diarrhea, nausea and vomiting.  Genitourinary:  Negative for dysuria, frequency and urgency.  Musculoskeletal:  Positive for myalgias. Negative for joint pain.       Left posterior upper arm pain  Neurological:  Negative for dizziness, focal weakness and headaches.       Objective:    Physical Exam Constitutional:      General: She is not in acute distress.    Appearance: She is not ill-appearing.  Eyes:     Extraocular Movements: Extraocular movements intact.     Conjunctiva/sclera: Conjunctivae normal.  Cardiovascular:     Rate and Rhythm: Normal rate.  Pulmonary:     Effort: Pulmonary effort is normal.  Musculoskeletal:     Left shoulder: Normal.     Left upper arm: Tenderness present. No swelling.     Left elbow: Normal.     Left wrist: Normal.     Cervical back: Normal range of motion and neck  supple.     Right lower leg: No edema.     Left lower leg: No edema.     Comments: TTP of tricep of left arm, pain with certain movements   Skin:    General: Skin is warm and dry.  Neurological:     General: No focal deficit present.     Mental Status: She is alert and oriented to person, place, and time.  Cranial Nerves: No cranial nerve deficit.     Sensory: No sensory deficit.     Motor: No weakness.     Coordination: Coordination normal.  Psychiatric:        Mood and Affect: Mood normal.        Behavior: Behavior normal.        Thought Content: Thought content normal.      BP 138/86 (BP Location: Left Arm, Patient Position: Sitting, Cuff Size: Large)   Pulse 79   Temp 97.6 F (36.4 C) (Temporal)   Ht 5\' 6"  (1.676 m)   Wt 198 lb (89.8 kg)   SpO2 98%   BMI 31.96 kg/m  Wt Readings from Last 3 Encounters:  06/20/23 198 lb (89.8 kg)  12/27/22 195 lb (88.5 kg)  06/28/22 194 lb (88 kg)       Assessment & Plan:   Problem List Items Addressed This Visit     BRBPR (bright red blood per rectum)    One episode with hard stools. She will keep stools soft with Miralax. Check fecal occult test, stool kit today. She will let me know if she sees more blood or follow up with GI. Colonoscopy is UTD      Relevant Orders   Fecal occult blood, imunochemical   CBC with Differential/Platelet (Completed)   Diuretic-induced hypokalemia    She has been out of diuretic x 2 months. Is not currently taking K-dur regularly but will restart along with chlorthalidone. Check BMP and follow up.       Essential hypertension - Primary    BP elevated today since she has been out of diuretic x 2 months. Continue Toprol XL and and Lotrel. Switched her from brand Thalitone to chlorthalidone due to availability and cost. Discussed with pharmD Cristy. Hx of allergy listed to HCTZ so I will not start her on Dyazide. Monitor BP at home. Check renal function. Follow up in 4 wks      Relevant  Medications   chlorthalidone (THALITONE) 15 MG tablet   amLODipine-benazepril (LOTREL) 10-40 MG capsule   atorvastatin (LIPITOR) 20 MG tablet   Other Relevant Orders   CBC with Differential/Platelet (Completed)   Comprehensive metabolic panel (Completed)   GERD    Refill Protonix for prn use. Avoid triggers.       Relevant Medications   pantoprazole (PROTONIX) 40 MG tablet   Hyperlipidemia associated with type 2 diabetes mellitus (HCC)    Continue statin therapy. LDL in goal.       Relevant Medications   chlorthalidone (THALITONE) 15 MG tablet   amLODipine-benazepril (LOTREL) 10-40 MG capsule   atorvastatin (LIPITOR) 20 MG tablet   Prediabetes    Checking HgA1c      Relevant Orders   Comprehensive metabolic panel (Completed)   Hemoglobin A1c (Completed)   Strain of left triceps    Use Voltaren gel, heat or ice. Be aware of activities that cause pain. Follow up in 4 wks.       Vitamin D deficiency    Continue vitamin D supplement.       Other Visit Diagnoses     Pap smear for cervical cancer screening       Relevant Orders   Ambulatory referral to Gynecology       I have discontinued Aron Baba Thalitone. I am also having her start on chlorthalidone. Additionally, I am having her maintain her cholecalciferol, aspirin, multivitamin with minerals, albuterol, metoprolol succinate, potassium chloride, amLODipine-benazepril, atorvastatin, and pantoprazole.  Meds ordered this encounter  Medications   chlorthalidone (THALITONE) 15 MG tablet    Sig: Take 1 tablet (15 mg total) by mouth daily.    Dispense:  90 tablet    Refill:  1    Order Specific Question:   Supervising Provider    Answer:   Hillard Danker A [[id number]]   amLODipine-benazepril (LOTREL) 10-40 MG capsule    Sig: Take 1 capsule by mouth daily.    Dispense:  90 capsule    Refill:  1    Order Specific Question:   Supervising Provider    Answer:   Hillard Danker A [[id number]]   atorvastatin  (LIPITOR) 20 MG tablet    Sig: TAKE 1 TABLET(20 MG) BY MOUTH DAILY    Dispense:  90 tablet    Refill:  1    Order Specific Question:   Supervising Provider    Answer:   Hillard Danker A [[id number]]   pantoprazole (PROTONIX) 40 MG tablet    Sig: TAKE 1 TABLET(40 MG) BY MOUTH DAILY    Dispense:  90 tablet    Refill:  1    Order Specific Question:   Supervising Provider    Answer:   Hillard Danker A [[id number]]

## 2023-06-20 NOTE — Assessment & Plan Note (Signed)
She has been out of diuretic x 2 months. Is not currently taking K-dur regularly but will restart along with chlorthalidone. Check BMP and follow up.

## 2023-06-20 NOTE — Assessment & Plan Note (Signed)
Continue statin therapy. LDL in goal.

## 2023-06-20 NOTE — Assessment & Plan Note (Signed)
One episode with hard stools. She will keep stools soft with Miralax. Check fecal occult test, stool kit today. She will let me know if she sees more blood or follow up with GI. Colonoscopy is UTD

## 2023-06-20 NOTE — Assessment & Plan Note (Signed)
Use Voltaren gel, heat or ice. Be aware of activities that cause pain. Follow up in 4 wks.

## 2023-06-20 NOTE — Assessment & Plan Note (Signed)
Continue vitamin D supplement

## 2023-06-20 NOTE — Assessment & Plan Note (Signed)
Refill Protonix for prn use. Avoid triggers.  

## 2023-06-20 NOTE — Patient Instructions (Addendum)
Please go downstairs for labs and stool test kit before you leave.  Keep your stools soft with Miralax if needed.   You should hear from the gynecology office to schedule a visit.  You can call them if you have not heard in 2 weeks.  Continue your current medications and I am switching you to chlorthalidone from the brand Thalitone.  Monitor your blood pressures at home and follow-up in 4 weeks.

## 2023-06-20 NOTE — Assessment & Plan Note (Signed)
BP elevated today since she has been out of diuretic x 2 months. Continue Toprol XL and and Lotrel. Switched her from brand Thalitone to chlorthalidone due to availability and cost. Discussed with pharmD Cristy. Hx of allergy listed to HCTZ so I will not start her on Dyazide. Monitor BP at home. Check renal function. Follow up in 4 wks

## 2023-06-26 ENCOUNTER — Ambulatory Visit (INDEPENDENT_AMBULATORY_CARE_PROVIDER_SITE_OTHER): Payer: BC Managed Care – PPO

## 2023-06-26 DIAGNOSIS — K625 Hemorrhage of anus and rectum: Secondary | ICD-10-CM

## 2023-06-26 LAB — FECAL OCCULT BLOOD, IMMUNOCHEMICAL: Fecal Occult Bld: NEGATIVE

## 2023-06-27 ENCOUNTER — Encounter: Payer: Self-pay | Admitting: Family Medicine

## 2023-06-27 NOTE — Telephone Encounter (Signed)
Please advise 

## 2023-06-28 ENCOUNTER — Other Ambulatory Visit: Payer: Self-pay | Admitting: Family

## 2023-06-28 MED ORDER — CHLORTHALIDONE 25 MG PO TABS: 12.5000 mg | ORAL_TABLET | Freq: Every day | ORAL | 1 refills | Status: AC

## 2023-06-28 NOTE — Telephone Encounter (Signed)
Spoke w pharmacy staff, 15 mg is on backorder at this time

## 2023-07-17 ENCOUNTER — Other Ambulatory Visit: Payer: Self-pay | Admitting: Family Medicine

## 2023-07-17 DIAGNOSIS — E876 Hypokalemia: Secondary | ICD-10-CM

## 2023-07-17 NOTE — Telephone Encounter (Signed)
You only sent 30 days last month, are we continuing this?

## 2023-07-18 ENCOUNTER — Ambulatory Visit: Payer: BC Managed Care – PPO | Admitting: Family Medicine

## 2023-08-16 ENCOUNTER — Other Ambulatory Visit: Payer: Self-pay | Admitting: Family Medicine

## 2023-08-16 DIAGNOSIS — E876 Hypokalemia: Secondary | ICD-10-CM

## 2023-09-16 ENCOUNTER — Other Ambulatory Visit: Payer: Self-pay | Admitting: Family Medicine

## 2023-09-16 DIAGNOSIS — E876 Hypokalemia: Secondary | ICD-10-CM

## 2023-10-19 ENCOUNTER — Other Ambulatory Visit: Payer: Self-pay | Admitting: Family Medicine

## 2023-10-19 DIAGNOSIS — E876 Hypokalemia: Secondary | ICD-10-CM

## 2023-11-21 ENCOUNTER — Other Ambulatory Visit: Payer: Self-pay | Admitting: Family Medicine

## 2023-11-21 DIAGNOSIS — K219 Gastro-esophageal reflux disease without esophagitis: Secondary | ICD-10-CM

## 2023-12-15 ENCOUNTER — Other Ambulatory Visit: Payer: Self-pay | Admitting: Adult Medicine

## 2023-12-15 ENCOUNTER — Ambulatory Visit
Admission: RE | Admit: 2023-12-15 | Discharge: 2023-12-15 | Disposition: A | Discharge status: Home / Self Care | Source: Ambulatory Visit | Attending: Adult Medicine | Admitting: Adult Medicine

## 2023-12-15 DIAGNOSIS — Z1231 Encounter for screening mammogram for malignant neoplasm of breast: Secondary | ICD-10-CM

## 2024-02-19 ENCOUNTER — Other Ambulatory Visit: Payer: Self-pay | Admitting: Family Medicine

## 2024-02-19 DIAGNOSIS — K219 Gastro-esophageal reflux disease without esophagitis: Secondary | ICD-10-CM

## 2024-03-13 ENCOUNTER — Encounter: Payer: Self-pay | Admitting: Physician Assistant

## 2024-03-13 ENCOUNTER — Ambulatory Visit: Admitting: Physician Assistant

## 2024-03-13 VITALS — BP 138/83

## 2024-03-13 DIAGNOSIS — D492 Neoplasm of unspecified behavior of bone, soft tissue, and skin: Secondary | ICD-10-CM | POA: Diagnosis not present

## 2024-03-13 DIAGNOSIS — D485 Neoplasm of uncertain behavior of skin: Secondary | ICD-10-CM

## 2024-03-13 DIAGNOSIS — L820 Inflamed seborrheic keratosis: Secondary | ICD-10-CM

## 2024-03-13 NOTE — Patient Instructions (Signed)

## 2024-03-13 NOTE — Progress Notes (Signed)
   New Patient Visit   Subjective  Gina Padilla is a 63 y.o. NEW PATIENT female who presents for the following: Pt states she has a mole on her left thigh. The mole has been there for years, it's not bothering her just wants to get it looked at. It has changed color and a portion of it has become raised.     The following portions of the chart were reviewed this encounter and updated as appropriate: medications, allergies, medical history  Review of Systems:  No other skin or systemic complaints except as noted in HPI or Assessment and Plan.  Objective  Well appearing patient in no apparent distress; mood and affect are within normal limits.  A focused examination was performed of the following areas: left thigh  Relevant exam findings are noted in the Assessment and Plan.  Left Thigh - Anterior 0.9 cm irregular brown papule    Assessment & Plan     NEOPLASM OF UNCERTAIN BEHAVIOR OF SKIN Left Thigh - Anterior Epidermal / dermal shaving  Lesion diameter (cm):  0.9 Informed consent: discussed and consent obtained   Timeout: patient name, date of birth, surgical site, and procedure verified   Procedure prep:  Patient was prepped and draped in usual sterile fashion Prep type:  Isopropyl alcohol Anesthesia: the lesion was anesthetized in a standard fashion   Anesthetic:  1% lidocaine w/ epinephrine 1-100,000 buffered w/ 8.4% NaHCO3 Instrument used: flexible razor blade   Hemostasis achieved with: pressure and aluminum chloride   Outcome: patient tolerated procedure well   Post-procedure details: sterile dressing applied and wound care instructions given   Dressing type: bandage and petrolatum    Specimen 1 - Surgical pathology Differential Diagnosis: SK vs dysplastic nevus vs MM  Check Margins: No  Return for pending biopsy results.  I, Doyce Pan, CMA, am acting as scribe for Earnestine Tuohey K, PA-C.   Documentation: I have reviewed the above documentation  for accuracy and completeness, and I agree with the above.  Darrell Leonhardt K, PA-C

## 2024-03-14 LAB — SURGICAL PATHOLOGY

## 2024-03-15 ENCOUNTER — Ambulatory Visit: Payer: Self-pay | Admitting: Physician Assistant

## 2024-05-16 ENCOUNTER — Other Ambulatory Visit: Payer: Self-pay | Admitting: Family Medicine

## 2024-05-16 DIAGNOSIS — K219 Gastro-esophageal reflux disease without esophagitis: Secondary | ICD-10-CM
